# Patient Record
Sex: Female | Born: 1945 | Race: White | Hispanic: No
Health system: Southern US, Community
[De-identification: ages and names within clinical notes are randomized; demographics above are authoritative.]

---

## 2006-01-14 ENCOUNTER — Encounter: Admission: RE | Admit: 2006-01-14 | Discharge: 2006-01-14 | Payer: Self-pay

## 2006-01-20 ENCOUNTER — Encounter (INDEPENDENT_AMBULATORY_CARE_PROVIDER_SITE_OTHER): Payer: Self-pay | Admitting: Diagnostic Radiology

## 2006-01-20 ENCOUNTER — Encounter: Admission: RE | Admit: 2006-01-20 | Discharge: 2006-01-20 | Payer: Self-pay

## 2006-01-20 ENCOUNTER — Encounter (INDEPENDENT_AMBULATORY_CARE_PROVIDER_SITE_OTHER): Payer: Self-pay | Admitting: Specialist

## 2006-02-04 ENCOUNTER — Encounter: Admission: RE | Admit: 2006-02-04 | Discharge: 2006-02-04 | Payer: Self-pay | Admitting: General Surgery

## 2006-02-06 ENCOUNTER — Ambulatory Visit: Payer: Self-pay | Admitting: Oncology

## 2006-02-11 ENCOUNTER — Encounter: Admission: RE | Admit: 2006-02-11 | Discharge: 2006-02-11 | Payer: Self-pay | Admitting: General Surgery

## 2006-02-11 ENCOUNTER — Ambulatory Visit: Admission: RE | Admit: 2006-02-11 | Discharge: 2006-03-11 | Payer: Self-pay | Admitting: Radiation Oncology

## 2006-02-13 ENCOUNTER — Encounter: Admission: RE | Admit: 2006-02-13 | Discharge: 2006-02-13 | Payer: Self-pay | Admitting: General Surgery

## 2006-02-13 ENCOUNTER — Encounter (INDEPENDENT_AMBULATORY_CARE_PROVIDER_SITE_OTHER): Payer: Self-pay | Admitting: Specialist

## 2006-02-13 ENCOUNTER — Ambulatory Visit (HOSPITAL_BASED_OUTPATIENT_CLINIC_OR_DEPARTMENT_OTHER): Admission: RE | Admit: 2006-02-13 | Discharge: 2006-02-13 | Payer: Self-pay | Admitting: General Surgery

## 2006-02-27 ENCOUNTER — Ambulatory Visit (HOSPITAL_COMMUNITY): Admission: RE | Admit: 2006-02-27 | Discharge: 2006-02-27 | Payer: Self-pay | Admitting: General Surgery

## 2006-03-09 LAB — COMPREHENSIVE METABOLIC PANEL
ALT: 9 U/L (ref 0–35)
AST: 15 U/L (ref 0–37)
Albumin: 4.1 g/dL (ref 3.5–5.2)
Alkaline Phosphatase: 85 U/L (ref 39–117)
Calcium: 9.2 mg/dL (ref 8.4–10.5)
Chloride: 108 mEq/L (ref 96–112)
Potassium: 4.3 mEq/L (ref 3.5–5.3)

## 2006-03-09 LAB — CBC WITH DIFFERENTIAL/PLATELET
BASO%: 0.3 % (ref 0.0–2.0)
EOS%: 1.4 % (ref 0.0–7.0)
MCH: 32.5 pg (ref 26.0–34.0)
MCHC: 35.7 g/dL (ref 32.0–36.0)
RBC: 3.49 10*6/uL — ABNORMAL LOW (ref 3.70–5.32)
RDW: 14.2 % (ref 11.3–14.5)
lymph#: 1.1 10*3/uL (ref 0.9–3.3)

## 2006-03-17 ENCOUNTER — Ambulatory Visit (HOSPITAL_COMMUNITY): Admission: RE | Admit: 2006-03-17 | Discharge: 2006-03-17 | Payer: Self-pay | Admitting: Oncology

## 2006-03-17 ENCOUNTER — Encounter (INDEPENDENT_AMBULATORY_CARE_PROVIDER_SITE_OTHER): Payer: Self-pay | Admitting: Cardiology

## 2006-03-23 ENCOUNTER — Ambulatory Visit: Payer: Self-pay | Admitting: Oncology

## 2006-03-31 LAB — CBC WITH DIFFERENTIAL/PLATELET
BASO%: 1.1 % (ref 0.0–2.0)
EOS%: 0.3 % (ref 0.0–7.0)
HCT: 34.1 % — ABNORMAL LOW (ref 34.8–46.6)
MCH: 31.8 pg (ref 26.0–34.0)
MCHC: 35.4 g/dL (ref 32.0–36.0)
MCV: 89.8 fL (ref 81.0–101.0)
MONO%: 11.3 % (ref 0.0–13.0)
NEUT%: 70.4 % (ref 39.6–76.8)
lymph#: 1.2 10*3/uL (ref 0.9–3.3)

## 2006-04-03 ENCOUNTER — Ambulatory Visit (HOSPITAL_BASED_OUTPATIENT_CLINIC_OR_DEPARTMENT_OTHER): Admission: RE | Admit: 2006-04-03 | Discharge: 2006-04-03 | Payer: Self-pay | Admitting: General Surgery

## 2006-04-13 LAB — CBC WITH DIFFERENTIAL/PLATELET
BASO%: 0.7 % (ref 0.0–2.0)
Basophils Absolute: 0 10*3/uL (ref 0.0–0.1)
EOS%: 1.2 % (ref 0.0–7.0)
HGB: 10.9 g/dL — ABNORMAL LOW (ref 11.6–15.9)
MCH: 31.6 pg (ref 26.0–34.0)
MCHC: 34 g/dL (ref 32.0–36.0)
MCV: 92.9 fL (ref 81.0–101.0)
MONO%: 10.8 % (ref 0.0–13.0)
RBC: 3.43 10*6/uL — ABNORMAL LOW (ref 3.70–5.32)
RDW: 13.7 % (ref 11.3–14.5)
lymph#: 1.1 10*3/uL (ref 0.9–3.3)

## 2006-04-20 LAB — CBC WITH DIFFERENTIAL/PLATELET
Basophils Absolute: 0 10*3/uL (ref 0.0–0.1)
EOS%: 0.6 % (ref 0.0–7.0)
Eosinophils Absolute: 0.1 10*3/uL (ref 0.0–0.5)
HCT: 28.6 % — ABNORMAL LOW (ref 34.8–46.6)
HGB: 10.1 g/dL — ABNORMAL LOW (ref 11.6–15.9)
MCH: 32.4 pg (ref 26.0–34.0)
MCV: 91.5 fL (ref 81.0–101.0)
NEUT#: 6.1 10*3/uL (ref 1.5–6.5)
NEUT%: 74.9 % (ref 39.6–76.8)
lymph#: 1.1 10*3/uL (ref 0.9–3.3)

## 2006-04-30 ENCOUNTER — Ambulatory Visit: Payer: Self-pay | Admitting: Oncology

## 2006-05-04 LAB — CBC WITH DIFFERENTIAL/PLATELET
Basophils Absolute: 0 10*3/uL (ref 0.0–0.1)
EOS%: 1.1 % (ref 0.0–7.0)
Eosinophils Absolute: 0.1 10*3/uL (ref 0.0–0.5)
HGB: 9.2 g/dL — ABNORMAL LOW (ref 11.6–15.9)
LYMPH%: 22.9 % (ref 14.0–48.0)
MCH: 31.8 pg (ref 26.0–34.0)
MCV: 91.6 fL (ref 81.0–101.0)
MONO%: 11.4 % (ref 0.0–13.0)
NEUT%: 63.8 % (ref 39.6–76.8)
Platelets: 227 10*3/uL (ref 145–400)
RDW: 14.8 % — ABNORMAL HIGH (ref 11.3–14.5)

## 2006-05-14 LAB — CBC WITH DIFFERENTIAL/PLATELET
BASO%: 0.1 % (ref 0.0–2.0)
EOS%: 0.1 % (ref 0.0–7.0)
LYMPH%: 8.4 % — ABNORMAL LOW (ref 14.0–48.0)
MCH: 32.1 pg (ref 26.0–34.0)
MCHC: 34.9 g/dL (ref 32.0–36.0)
MCV: 91.9 fL (ref 81.0–101.0)
MONO%: 3.1 % (ref 0.0–13.0)
NEUT#: 14 10*3/uL — ABNORMAL HIGH (ref 1.5–6.5)
Platelets: 191 10*3/uL (ref 145–400)
RBC: 2.73 10*6/uL — ABNORMAL LOW (ref 3.70–5.32)
RDW: 17.5 % — ABNORMAL HIGH (ref 11.3–14.5)

## 2006-05-15 ENCOUNTER — Encounter (HOSPITAL_COMMUNITY): Admission: RE | Admit: 2006-05-15 | Discharge: 2006-05-15 | Payer: Self-pay | Admitting: Oncology

## 2006-05-15 LAB — CBC WITH DIFFERENTIAL/PLATELET
Eosinophils Absolute: 0.1 10*3/uL (ref 0.0–0.5)
HCT: 25.7 % — ABNORMAL LOW (ref 34.8–46.6)
LYMPH%: 14.2 % (ref 14.0–48.0)
MCHC: 34.7 g/dL (ref 32.0–36.0)
MCV: 92.4 fL (ref 81.0–101.0)
MONO#: 0.6 10*3/uL (ref 0.1–0.9)
MONO%: 4.6 % (ref 0.0–13.0)
NEUT%: 80.7 % — ABNORMAL HIGH (ref 39.6–76.8)
Platelets: 185 10*3/uL (ref 145–400)
RBC: 2.78 10*6/uL — ABNORMAL LOW (ref 3.70–5.32)
WBC: 12.7 10*3/uL — ABNORMAL HIGH (ref 3.9–10.0)

## 2006-05-15 LAB — COMPREHENSIVE METABOLIC PANEL
AST: 17 U/L (ref 0–37)
Albumin: 3.7 g/dL (ref 3.5–5.2)
Alkaline Phosphatase: 109 U/L (ref 39–117)
Glucose, Bld: 108 mg/dL — ABNORMAL HIGH (ref 70–99)
Potassium: 3.8 mEq/L (ref 3.5–5.3)
Sodium: 137 mEq/L (ref 135–145)
Total Bilirubin: 0.3 mg/dL (ref 0.3–1.2)
Total Protein: 5.7 g/dL — ABNORMAL LOW (ref 6.0–8.3)

## 2006-05-28 LAB — CBC WITH DIFFERENTIAL/PLATELET
Basophils Absolute: 0 10*3/uL (ref 0.0–0.1)
Eosinophils Absolute: 0.1 10*3/uL (ref 0.0–0.5)
HGB: 10.9 g/dL — ABNORMAL LOW (ref 11.6–15.9)
MCV: 92.1 fL (ref 81.0–101.0)
MONO#: 0.4 10*3/uL (ref 0.1–0.9)
MONO%: 7.9 % (ref 0.0–13.0)
NEUT#: 3.7 10*3/uL (ref 1.5–6.5)
RBC: 3.42 10*6/uL — ABNORMAL LOW (ref 3.70–5.32)
RDW: 14.9 % — ABNORMAL HIGH (ref 11.3–14.5)
WBC: 5.7 10*3/uL (ref 3.9–10.0)

## 2006-05-28 LAB — COMPREHENSIVE METABOLIC PANEL
Albumin: 3.7 g/dL (ref 3.5–5.2)
BUN: 15 mg/dL (ref 6–23)
CO2: 25 mEq/L (ref 19–32)
Calcium: 8.9 mg/dL (ref 8.4–10.5)
Chloride: 108 mEq/L (ref 96–112)
Glucose, Bld: 92 mg/dL (ref 70–99)
Potassium: 4 mEq/L (ref 3.5–5.3)
Sodium: 141 mEq/L (ref 135–145)
Total Protein: 6.2 g/dL (ref 6.0–8.3)

## 2006-06-04 LAB — CBC WITH DIFFERENTIAL/PLATELET
BASO%: 0.3 % (ref 0.0–2.0)
EOS%: 1.2 % (ref 0.0–7.0)
HGB: 11.1 g/dL — ABNORMAL LOW (ref 11.6–15.9)
LYMPH%: 37 % (ref 14.0–48.0)
MCV: 92.1 fL (ref 81.0–101.0)
NEUT#: 0.9 10*3/uL — ABNORMAL LOW (ref 1.5–6.5)
NEUT%: 32 % — ABNORMAL LOW (ref 39.6–76.8)
RBC: 3.45 10*6/uL — ABNORMAL LOW (ref 3.70–5.32)
RDW: 17.6 % — ABNORMAL HIGH (ref 11.3–14.5)
WBC: 2.9 10*3/uL — ABNORMAL LOW (ref 3.9–10.0)
lymph#: 1.1 10*3/uL (ref 0.9–3.3)

## 2006-06-15 ENCOUNTER — Ambulatory Visit: Payer: Self-pay | Admitting: Oncology

## 2006-06-17 LAB — CBC WITH DIFFERENTIAL/PLATELET
BASO%: 0.8 % (ref 0.0–2.0)
EOS%: 0.5 % (ref 0.0–7.0)
HCT: 27.7 % — ABNORMAL LOW (ref 34.8–46.6)
MCH: 32.8 pg (ref 26.0–34.0)
MCHC: 35.4 g/dL (ref 32.0–36.0)
MONO%: 6.5 % (ref 0.0–13.0)
NEUT%: 75.7 % (ref 39.6–76.8)
lymph#: 1.1 10*3/uL (ref 0.9–3.3)

## 2006-06-17 LAB — COMPREHENSIVE METABOLIC PANEL
ALT: 14 U/L (ref 0–35)
Alkaline Phosphatase: 101 U/L (ref 39–117)
Sodium: 138 mEq/L (ref 135–145)
Total Bilirubin: 0.4 mg/dL (ref 0.3–1.2)
Total Protein: 6.1 g/dL (ref 6.0–8.3)

## 2006-06-25 LAB — CBC WITH DIFFERENTIAL/PLATELET
BASO%: 0.1 % (ref 0.0–2.0)
Eosinophils Absolute: 0.1 10*3/uL (ref 0.0–0.5)
LYMPH%: 30.8 % (ref 14.0–48.0)
MCHC: 35.2 g/dL (ref 32.0–36.0)
MCV: 93.8 fL (ref 81.0–101.0)
MONO%: 20 % — ABNORMAL HIGH (ref 0.0–13.0)
NEUT%: 47.8 % (ref 39.6–76.8)
Platelets: 246 10*3/uL (ref 145–400)
RBC: 2.96 10*6/uL — ABNORMAL LOW (ref 3.70–5.32)

## 2006-07-03 LAB — CBC WITH DIFFERENTIAL/PLATELET
BASO%: 0.2 % (ref 0.0–2.0)
EOS%: 0.6 % (ref 0.0–7.0)
LYMPH%: 13 % — ABNORMAL LOW (ref 14.0–48.0)
MCH: 32.6 pg (ref 26.0–34.0)
MCHC: 34.8 g/dL (ref 32.0–36.0)
MONO#: 0.8 10*3/uL (ref 0.1–0.9)
Platelets: 268 10*3/uL (ref 145–400)
RBC: 2.87 10*6/uL — ABNORMAL LOW (ref 3.70–5.32)
WBC: 8.7 10*3/uL (ref 3.9–10.0)

## 2006-07-09 LAB — CBC WITH DIFFERENTIAL/PLATELET
BASO%: 0.8 % (ref 0.0–2.0)
EOS%: 1.2 % (ref 0.0–7.0)
HCT: 29 % — ABNORMAL LOW (ref 34.8–46.6)
MCH: 32.3 pg (ref 26.0–34.0)
MCHC: 34.5 g/dL (ref 32.0–36.0)
MONO#: 0.8 10*3/uL (ref 0.1–0.9)
NEUT%: 67.1 % (ref 39.6–76.8)
RBC: 3.1 10*6/uL — ABNORMAL LOW (ref 3.70–5.32)
RDW: 17.6 % — ABNORMAL HIGH (ref 11.3–14.5)
WBC: 6.7 10*3/uL (ref 3.9–10.0)
lymph#: 1.3 10*3/uL (ref 0.9–3.3)

## 2006-07-09 LAB — COMPREHENSIVE METABOLIC PANEL
ALT: 16 U/L (ref 0–35)
AST: 36 U/L (ref 0–37)
CO2: 22 mEq/L (ref 19–32)
Calcium: 8.8 mg/dL (ref 8.4–10.5)
Chloride: 108 mEq/L (ref 96–112)
Potassium: 4.3 mEq/L (ref 3.5–5.3)
Sodium: 140 mEq/L (ref 135–145)
Total Protein: 6.6 g/dL (ref 6.0–8.3)

## 2006-07-13 ENCOUNTER — Ambulatory Visit (HOSPITAL_COMMUNITY): Admission: RE | Admit: 2006-07-13 | Discharge: 2006-07-13 | Payer: Self-pay | Admitting: Oncology

## 2006-07-14 ENCOUNTER — Encounter: Payer: Self-pay | Admitting: Oncology

## 2006-07-14 ENCOUNTER — Ambulatory Visit: Admission: EM | Admit: 2006-07-14 | Discharge: 2006-07-14 | Payer: Self-pay | Admitting: Oncology

## 2006-07-16 LAB — CBC WITH DIFFERENTIAL/PLATELET
BASO%: 0.6 % (ref 0.0–2.0)
EOS%: 4.4 % (ref 0.0–7.0)
HCT: 28.1 % — ABNORMAL LOW (ref 34.8–46.6)
LYMPH%: 41.7 % (ref 14.0–48.0)
MCH: 33.5 pg (ref 26.0–34.0)
MCHC: 35.6 g/dL (ref 32.0–36.0)
MONO#: 0.6 10*3/uL (ref 0.1–0.9)
NEUT%: 27.4 % — ABNORMAL LOW (ref 39.6–76.8)
RBC: 2.99 10*6/uL — ABNORMAL LOW (ref 3.70–5.32)
WBC: 2.2 10*3/uL — ABNORMAL LOW (ref 3.9–10.0)
lymph#: 0.9 10*3/uL (ref 0.9–3.3)

## 2006-07-28 ENCOUNTER — Ambulatory Visit: Payer: Self-pay | Admitting: Oncology

## 2006-07-28 ENCOUNTER — Ambulatory Visit (HOSPITAL_COMMUNITY): Admission: RE | Admit: 2006-07-28 | Discharge: 2006-07-28 | Payer: Self-pay | Admitting: Oncology

## 2006-07-30 LAB — COMPREHENSIVE METABOLIC PANEL
ALT: 14 U/L (ref 0–35)
Albumin: 3.9 g/dL (ref 3.5–5.2)
Alkaline Phosphatase: 94 U/L (ref 39–117)
CO2: 24 mEq/L (ref 19–32)
Glucose, Bld: 96 mg/dL (ref 70–99)
Potassium: 3.9 mEq/L (ref 3.5–5.3)
Sodium: 141 mEq/L (ref 135–145)
Total Bilirubin: 0.4 mg/dL (ref 0.3–1.2)
Total Protein: 6.1 g/dL (ref 6.0–8.3)

## 2006-07-30 LAB — CBC WITH DIFFERENTIAL/PLATELET
BASO%: 0.6 % (ref 0.0–2.0)
Eosinophils Absolute: 0.1 10*3/uL (ref 0.0–0.5)
LYMPH%: 15.4 % (ref 14.0–48.0)
MCHC: 35 g/dL (ref 32.0–36.0)
MONO#: 0.6 10*3/uL (ref 0.1–0.9)
MONO%: 7.4 % (ref 0.0–13.0)
NEUT#: 6.2 10*3/uL (ref 1.5–6.5)
RBC: 3 10*6/uL — ABNORMAL LOW (ref 3.70–5.32)
RDW: 16.4 % — ABNORMAL HIGH (ref 11.3–14.5)
WBC: 8.2 10*3/uL (ref 3.9–10.0)

## 2007-01-13 ENCOUNTER — Ambulatory Visit: Payer: Self-pay | Admitting: Oncology

## 2007-01-15 ENCOUNTER — Ambulatory Visit: Admission: RE | Admit: 2007-01-15 | Discharge: 2007-01-15 | Payer: Self-pay | Admitting: Oncology

## 2007-01-15 ENCOUNTER — Encounter: Payer: Self-pay | Admitting: Oncology

## 2007-01-15 ENCOUNTER — Ambulatory Visit: Payer: Self-pay | Admitting: Internal Medicine

## 2007-01-15 LAB — CBC WITH DIFFERENTIAL/PLATELET
BASO%: 0.8 % (ref 0.0–2.0)
EOS%: 1.4 % (ref 0.0–7.0)
LYMPH%: 28.4 % (ref 14.0–48.0)
MCHC: 35.5 g/dL (ref 32.0–36.0)
MCV: 96.6 fL (ref 81.0–101.0)
MONO%: 10.7 % (ref 0.0–13.0)
NEUT#: 2.2 10*3/uL (ref 1.5–6.5)
Platelets: 228 10*3/uL (ref 145–400)
RBC: 3.37 10*6/uL — ABNORMAL LOW (ref 3.70–5.32)
RDW: 13.4 % (ref 11.3–14.5)

## 2007-01-15 LAB — COMPREHENSIVE METABOLIC PANEL
ALT: 14 U/L (ref 0–35)
AST: 20 U/L (ref 0–37)
Alkaline Phosphatase: 78 U/L (ref 39–117)
Creatinine, Ser: 1 mg/dL (ref 0.40–1.20)
Sodium: 141 mEq/L (ref 135–145)
Total Bilirubin: 0.7 mg/dL (ref 0.3–1.2)
Total Protein: 5.9 g/dL — ABNORMAL LOW (ref 6.0–8.3)

## 2007-01-15 LAB — CANCER ANTIGEN 27.29: CA 27.29: 26 U/mL (ref 0–39)

## 2007-02-08 ENCOUNTER — Encounter: Admission: RE | Admit: 2007-02-08 | Discharge: 2007-02-08 | Payer: Self-pay | Admitting: Oncology

## 2007-04-23 ENCOUNTER — Ambulatory Visit: Payer: Self-pay | Admitting: Oncology

## 2007-04-28 ENCOUNTER — Encounter: Payer: Self-pay | Admitting: Oncology

## 2007-04-28 ENCOUNTER — Ambulatory Visit: Admission: RE | Admit: 2007-04-28 | Discharge: 2007-04-28 | Payer: Self-pay | Admitting: Oncology

## 2007-04-28 ENCOUNTER — Ambulatory Visit: Payer: Self-pay | Admitting: Cardiology

## 2007-04-28 ENCOUNTER — Ambulatory Visit (HOSPITAL_COMMUNITY): Admission: RE | Admit: 2007-04-28 | Discharge: 2007-04-28 | Payer: Self-pay | Admitting: Oncology

## 2007-04-28 LAB — CBC WITH DIFFERENTIAL/PLATELET
BASO%: 0.2 % (ref 0.0–2.0)
HCT: 32 % — ABNORMAL LOW (ref 34.8–46.6)
MCHC: 36 g/dL (ref 32.0–36.0)
MONO#: 0.4 10*3/uL (ref 0.1–0.9)
NEUT%: 54.8 % (ref 39.6–76.8)
WBC: 3.9 10*3/uL (ref 3.9–10.0)
lymph#: 1.3 10*3/uL (ref 0.9–3.3)

## 2007-04-28 LAB — COMPREHENSIVE METABOLIC PANEL
ALT: 19 U/L (ref 0–35)
Albumin: 3.5 g/dL (ref 3.5–5.2)
CO2: 30 mEq/L (ref 19–32)
Calcium: 8.8 mg/dL (ref 8.4–10.5)
Chloride: 107 mEq/L (ref 96–112)
Creatinine, Ser: 0.92 mg/dL (ref 0.40–1.20)
Sodium: 141 mEq/L (ref 135–145)
Total Protein: 6.2 g/dL (ref 6.0–8.3)

## 2007-04-28 LAB — CANCER ANTIGEN 27.29: CA 27.29: 25 U/mL (ref 0–39)

## 2007-05-03 ENCOUNTER — Ambulatory Visit (HOSPITAL_BASED_OUTPATIENT_CLINIC_OR_DEPARTMENT_OTHER): Admission: RE | Admit: 2007-05-03 | Discharge: 2007-05-03 | Payer: Self-pay | Admitting: General Surgery

## 2007-07-02 IMAGING — CT CT CHEST W/ CM
1 of 2 series · 15 of 32 positions shown, 19 images · IV contrast (omnipaque)
Comparison: 03/17/2006

CLINICAL DATA: Breast cancer
CHEST CT WITH CONTRAST
TECHNIQUE: Multidetector CT imaging of the chest was performed following the
standard protocol during bolus administration of intravenous contrast.

Contrast:  80 cc Omnipaque 300

[Series 2: chest routine 5.0 b40f · axial · 0.65mm/px · z∈[-342,-68]mm · 15 of 61 slices shown, 19 images]
[im 3/61  mediastinal]
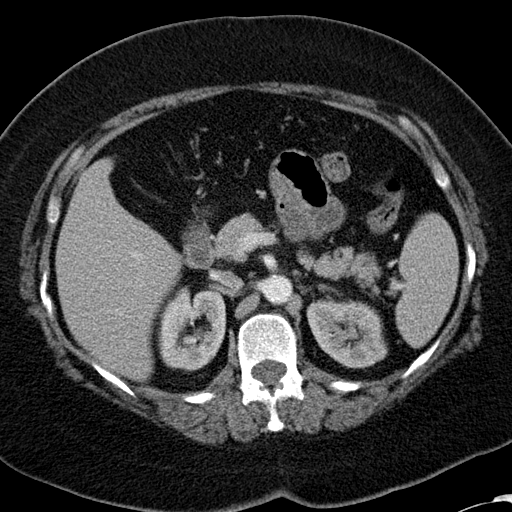
[im 3/61  lung]
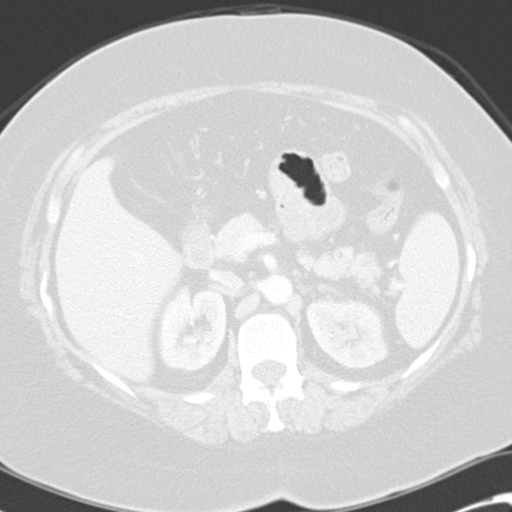
[im 7/61  lung]
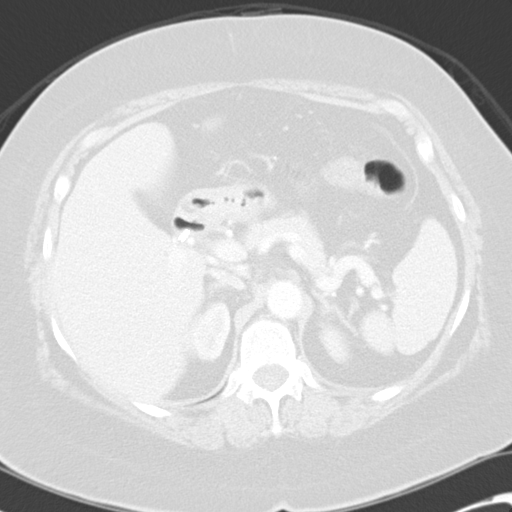
[im 12/61  lung]
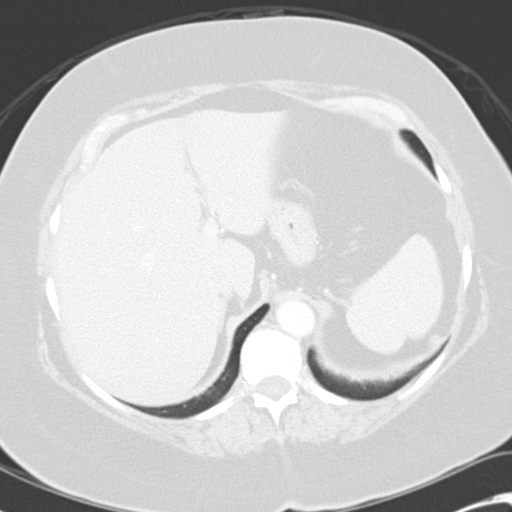
[im 16/61  lung]
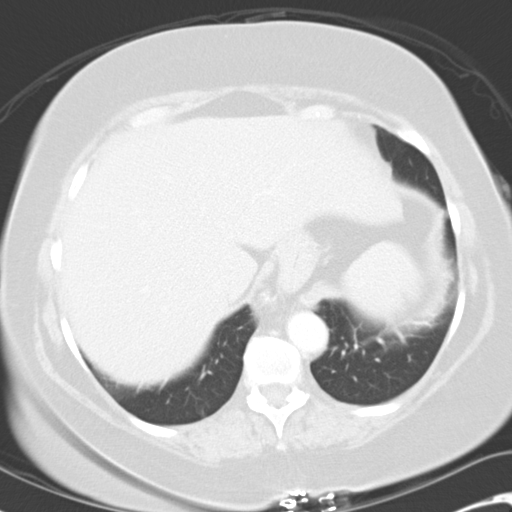
[im 18/61  mediastinal]
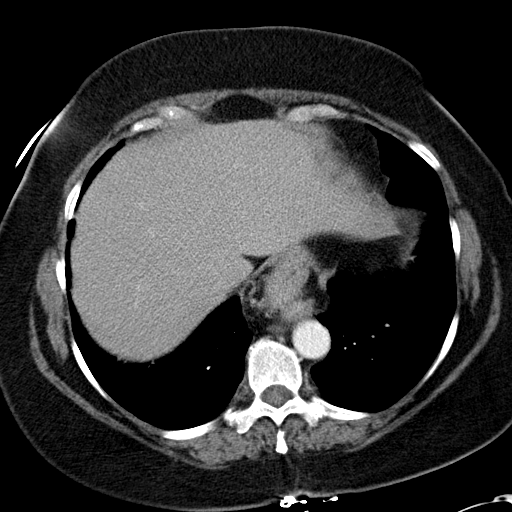
[im 18/61  lung]
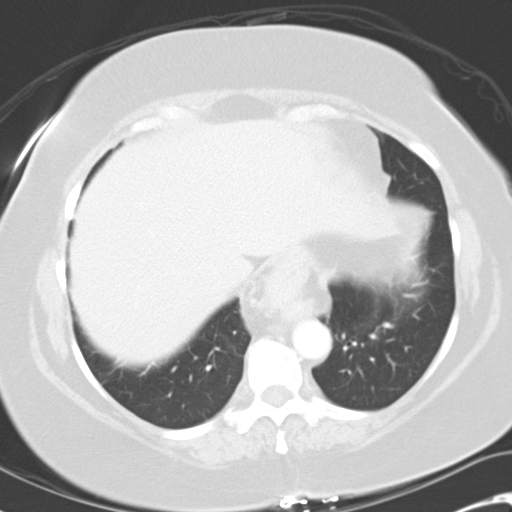
[im 23/61  lung]
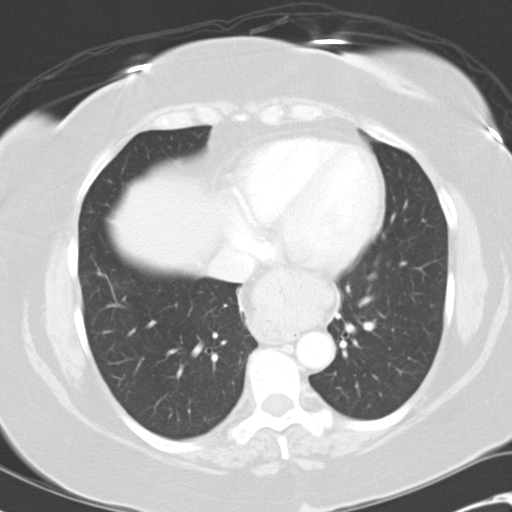
[im 27/61  lung]
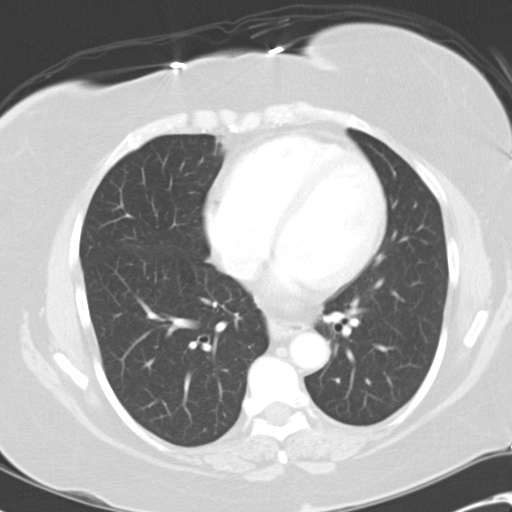
[im 32/61  lung]
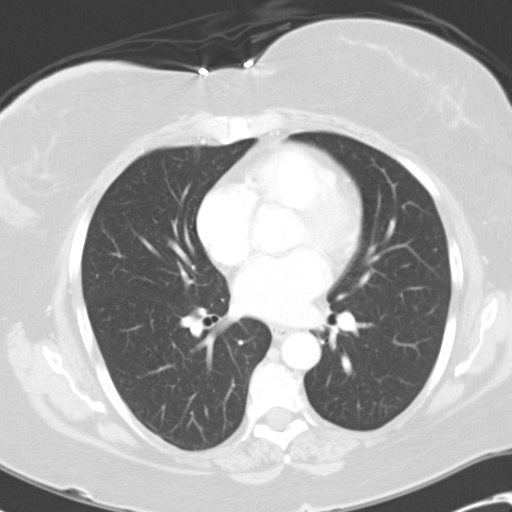
[im 34/61  mediastinal]
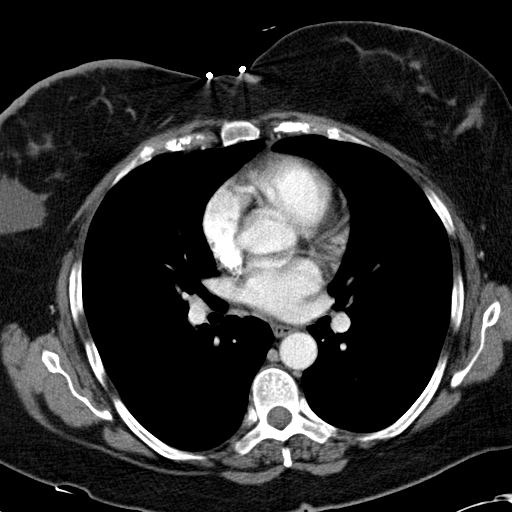
[im 34/61  lung]
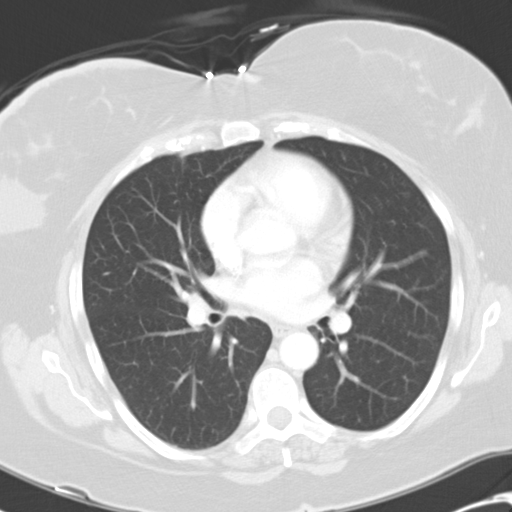
[im 38/61  lung]
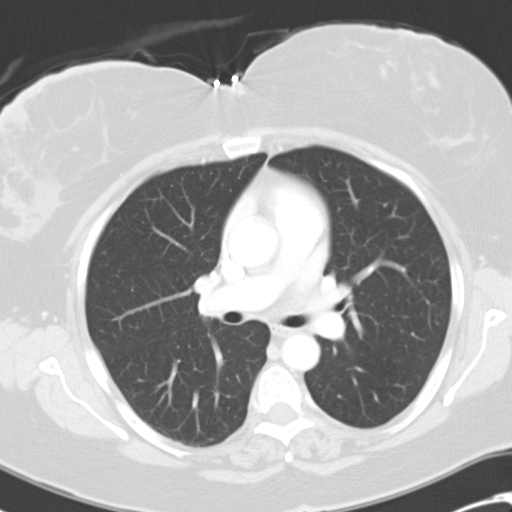
[im 43/61  lung]
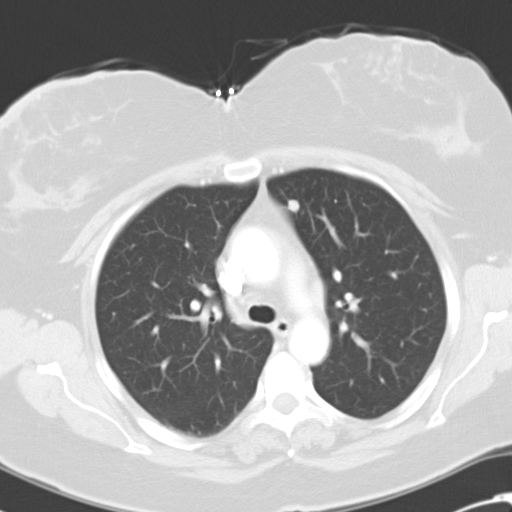
[im 45/61  lung]
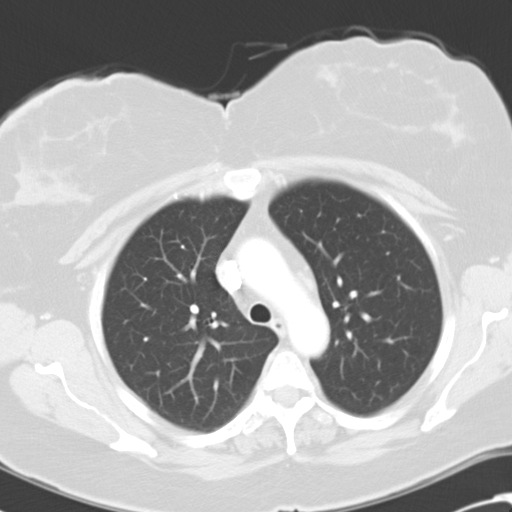
[im 49/61  mediastinal]
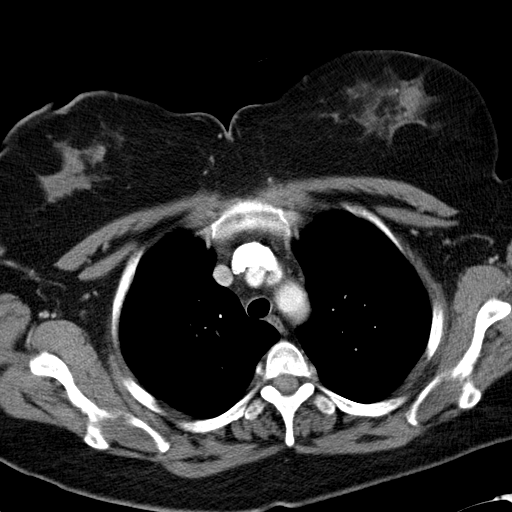
[im 49/61  lung]
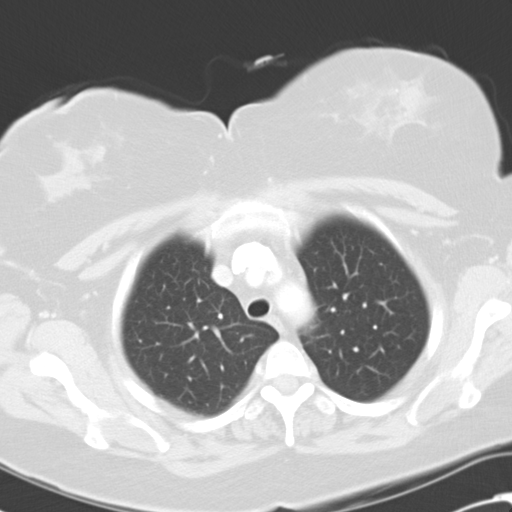
[im 54/61  lung]
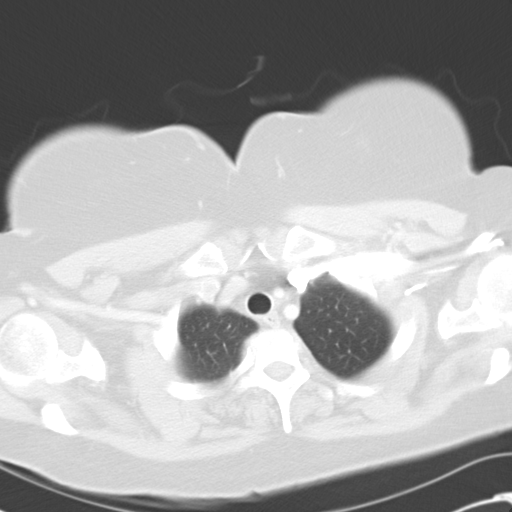
[im 58/61  lung]
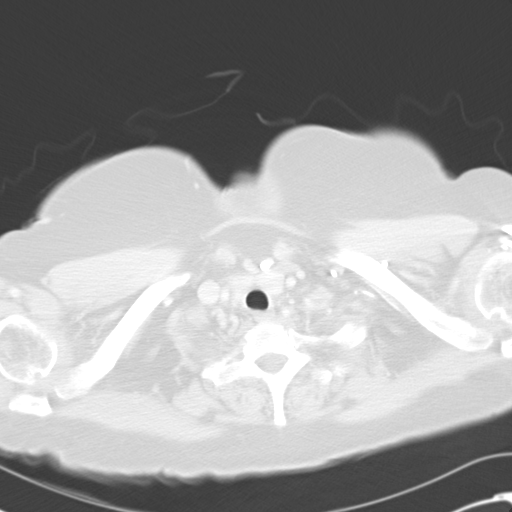

[15 of 32 positions shown; findings below may reference images not displayed]

FINDINGS: No enlarge axillary lymph nodes.

No mediastinal, or hilar adenopathy.

No pericardial, or pleural effusions.

There is a large hiatal hernia.

The pulmonary nodule within the left upper lobe measures 8.3 x 7.4 mm.
Previously, this measured 7.3 x 7.3 mm. New small adjacent nodule measures and
5.9 x 4.7 mm. This was not present on the prior exam.

No additional pulmonary nodules or masses are noted.

Review of the bone window shows no lytic or sclerotic lesions.

IMPRESSION

Left upper lobe pulmonary nodule is minimally increased in size compared to
prior exam. Additionally, there is an adjacent nodule which is new from the
prior study. PET-CT is suggested to assess the degree of hypermetabolism within
these nodules.

## 2008-01-07 ENCOUNTER — Ambulatory Visit: Payer: Self-pay | Admitting: Oncology

## 2008-01-11 ENCOUNTER — Ambulatory Visit (HOSPITAL_COMMUNITY): Admission: RE | Admit: 2008-01-11 | Discharge: 2008-01-11 | Payer: Self-pay | Admitting: Oncology

## 2008-01-11 LAB — CBC WITH DIFFERENTIAL/PLATELET
Basophils Absolute: 0 10*3/uL (ref 0.0–0.1)
Eosinophils Absolute: 0 10*3/uL (ref 0.0–0.5)
HGB: 13.1 g/dL (ref 11.6–15.9)
LYMPH%: 23.7 % (ref 14.0–48.0)
MCV: 98.8 fL (ref 81.0–101.0)
MONO%: 10.1 % (ref 0.0–13.0)
NEUT#: 3.6 10*3/uL (ref 1.5–6.5)
NEUT%: 64.9 % (ref 39.6–76.8)
Platelets: 229 10*3/uL (ref 145–400)

## 2008-01-11 LAB — COMPREHENSIVE METABOLIC PANEL
Albumin: 3.8 g/dL (ref 3.5–5.2)
Alkaline Phosphatase: 75 U/L (ref 39–117)
BUN: 18 mg/dL (ref 6–23)
Creatinine, Ser: 0.89 mg/dL (ref 0.40–1.20)
Glucose, Bld: 89 mg/dL (ref 70–99)
Potassium: 4 mEq/L (ref 3.5–5.3)
Total Bilirubin: 0.6 mg/dL (ref 0.3–1.2)

## 2008-01-12 LAB — VITAMIN D 25 HYDROXY (VIT D DEFICIENCY, FRACTURES): Vit D, 25-Hydroxy: 33 ng/mL (ref 30–89)

## 2008-02-07 ENCOUNTER — Encounter: Admission: RE | Admit: 2008-02-07 | Discharge: 2008-02-07 | Payer: Self-pay | Admitting: Oncology

## 2009-01-31 ENCOUNTER — Ambulatory Visit: Payer: Self-pay | Admitting: Oncology

## 2009-02-05 ENCOUNTER — Encounter: Admission: RE | Admit: 2009-02-05 | Discharge: 2009-02-05 | Payer: Self-pay | Admitting: Oncology

## 2009-02-05 LAB — COMPREHENSIVE METABOLIC PANEL
ALT: 13 U/L (ref 0–35)
AST: 17 U/L (ref 0–37)
Albumin: 3.9 g/dL (ref 3.5–5.2)
Alkaline Phosphatase: 54 U/L (ref 39–117)
BUN: 19 mg/dL (ref 6–23)
CO2: 26 mEq/L (ref 19–32)
Calcium: 9.2 mg/dL (ref 8.4–10.5)
Chloride: 105 mEq/L (ref 96–112)
Creatinine, Ser: 0.89 mg/dL (ref 0.40–1.20)
Glucose, Bld: 91 mg/dL (ref 70–99)
Potassium: 4.1 mEq/L (ref 3.5–5.3)
Sodium: 140 mEq/L (ref 135–145)
Total Bilirubin: 0.3 mg/dL (ref 0.3–1.2)
Total Protein: 6 g/dL (ref 6.0–8.3)

## 2009-02-05 LAB — CBC WITH DIFFERENTIAL/PLATELET
BASO%: 0.4 % (ref 0.0–2.0)
Basophils Absolute: 0 10*3/uL (ref 0.0–0.1)
EOS%: 1.3 % (ref 0.0–7.0)
MCH: 35.6 pg — ABNORMAL HIGH (ref 25.1–34.0)
MCHC: 34.8 g/dL (ref 31.5–36.0)
MCV: 102.3 fL — ABNORMAL HIGH (ref 79.5–101.0)
MONO%: 7.7 % (ref 0.0–14.0)
RBC: 3.46 10*6/uL — ABNORMAL LOW (ref 3.70–5.45)
RDW: 13 % (ref 11.2–14.5)
lymph#: 1.4 10*3/uL (ref 0.9–3.3)

## 2009-02-05 LAB — CANCER ANTIGEN 27.29: CA 27.29: 25 U/mL (ref 0–39)

## 2009-10-23 ENCOUNTER — Ambulatory Visit: Payer: Self-pay | Admitting: Oncology

## 2010-01-17 ENCOUNTER — Ambulatory Visit (HOSPITAL_COMMUNITY)
Admission: RE | Admit: 2010-01-17 | Discharge: 2010-01-17 | Payer: Self-pay | Source: Home / Self Care | Attending: Oncology | Admitting: Oncology

## 2010-01-30 ENCOUNTER — Ambulatory Visit: Payer: Self-pay | Admitting: Oncology

## 2010-02-05 ENCOUNTER — Encounter
Admission: RE | Admit: 2010-02-05 | Discharge: 2010-02-05 | Payer: Self-pay | Source: Home / Self Care | Attending: Oncology | Admitting: Oncology

## 2010-02-05 LAB — CBC & DIFF AND RETIC
BASO%: 0.5 % (ref 0.0–2.0)
EOS%: 0.9 % (ref 0.0–7.0)
HCT: 37.2 % (ref 34.8–46.6)
HGB: 12.9 g/dL (ref 11.6–15.9)
Immature Retic Fract: 3.7 % (ref 0.00–10.70)
MCH: 33.4 pg (ref 25.1–34.0)
MCHC: 34.7 g/dL (ref 31.5–36.0)
MONO#: 0.5 10*3/uL (ref 0.1–0.9)
NEUT%: 63.2 % (ref 38.4–76.8)
RDW: 13.1 % (ref 11.2–14.5)
Retic %: 0.89 % (ref 0.50–1.50)
Retic Ct Abs: 34.35 10*3/uL (ref 18.30–72.70)
WBC: 4.3 10*3/uL (ref 3.9–10.3)
lymph#: 1 10*3/uL (ref 0.9–3.3)

## 2010-02-05 LAB — COMPREHENSIVE METABOLIC PANEL
ALT: 12 U/L (ref 0–35)
AST: 17 U/L (ref 0–37)
Albumin: 4.2 g/dL (ref 3.5–5.2)
Alkaline Phosphatase: 71 U/L (ref 39–117)
BUN: 15 mg/dL (ref 6–23)
CO2: 28 mEq/L (ref 19–32)
Calcium: 9.5 mg/dL (ref 8.4–10.5)
Chloride: 108 mEq/L (ref 96–112)
Creatinine, Ser: 0.81 mg/dL (ref 0.40–1.20)
Glucose, Bld: 63 mg/dL — ABNORMAL LOW (ref 70–99)
Potassium: 4.3 mEq/L (ref 3.5–5.3)
Sodium: 144 mEq/L (ref 135–145)
Total Bilirubin: 0.4 mg/dL (ref 0.3–1.2)
Total Protein: 6.2 g/dL (ref 6.0–8.3)

## 2010-02-05 LAB — FERRITIN: Ferritin: 29 ng/mL (ref 10–291)

## 2010-02-24 ENCOUNTER — Encounter: Payer: Self-pay | Admitting: General Surgery

## 2010-04-29 ENCOUNTER — Other Ambulatory Visit: Payer: Self-pay | Admitting: Oncology

## 2010-04-29 ENCOUNTER — Encounter (HOSPITAL_BASED_OUTPATIENT_CLINIC_OR_DEPARTMENT_OTHER): Payer: Medicare Other | Admitting: Oncology

## 2010-04-29 DIAGNOSIS — Z170 Estrogen receptor positive status [ER+]: Secondary | ICD-10-CM

## 2010-04-29 DIAGNOSIS — Z9889 Other specified postprocedural states: Secondary | ICD-10-CM

## 2010-04-29 DIAGNOSIS — Z780 Asymptomatic menopausal state: Secondary | ICD-10-CM

## 2010-04-29 DIAGNOSIS — Z853 Personal history of malignant neoplasm of breast: Secondary | ICD-10-CM

## 2010-04-29 DIAGNOSIS — C50419 Malignant neoplasm of upper-outer quadrant of unspecified female breast: Secondary | ICD-10-CM

## 2010-04-29 LAB — CBC WITH DIFFERENTIAL/PLATELET
Basophils Absolute: 0 10*3/uL (ref 0.0–0.1)
EOS%: 1.4 % (ref 0.0–7.0)
Eosinophils Absolute: 0.1 10*3/uL (ref 0.0–0.5)
LYMPH%: 31.6 % (ref 14.0–49.7)
MCH: 33.5 pg (ref 25.1–34.0)
MCV: 97.9 fL (ref 79.5–101.0)
MONO%: 8.9 % (ref 0.0–14.0)
NEUT#: 2.6 10*3/uL (ref 1.5–6.5)
Platelets: 184 10*3/uL (ref 145–400)
RBC: 3.79 10*6/uL (ref 3.70–5.45)

## 2010-04-29 LAB — COMPREHENSIVE METABOLIC PANEL
AST: 22 U/L (ref 0–37)
Alkaline Phosphatase: 65 U/L (ref 39–117)
BUN: 14 mg/dL (ref 6–23)
Glucose, Bld: 84 mg/dL (ref 70–99)
Sodium: 142 mEq/L (ref 135–145)
Total Bilirubin: 0.7 mg/dL (ref 0.3–1.2)

## 2010-04-30 ENCOUNTER — Ambulatory Visit
Admission: RE | Admit: 2010-04-30 | Discharge: 2010-04-30 | Disposition: A | Discharge status: Home / Self Care | Payer: Medicare Other | Source: Ambulatory Visit | Attending: Oncology | Admitting: Oncology

## 2010-04-30 DIAGNOSIS — Z780 Asymptomatic menopausal state: Secondary | ICD-10-CM

## 2010-04-30 DIAGNOSIS — Z853 Personal history of malignant neoplasm of breast: Secondary | ICD-10-CM

## 2010-06-18 NOTE — Op Note (Signed)
Michele Reese, Michele Reese                ACCOUNT NO.:  [id number]   MEDICAL RECORD NO.:  [id number]          PATIENT TYPE:  AMB   LOCATION:  DSC                          FACILITY:  [facility]   PHYSICIAN:  Ollen Gross. Vernell Morgans, M.D. DATE OF BIRTH:  July 16, 1945   DATE OF PROCEDURE:  05/03/2007  DATE OF DISCHARGE:  04/28/2007                               OPERATIVE REPORT   PREOPERATIVE DIAGNOSIS:  Right breast cancer.   POSTOPERATIVE DIAGNOSIS:  Right breast cancer.   PROCEDURE:  Removal of Port-A-Cath.   SURGEON:  Ollen Gross. Vernell Morgans, M.D.   ANESTHESIA:  Local with IV sedation.   PROCEDURE:  After informed consent was obtained, the patient was brought  to the operating room and placed in supine position on the operating  table.  After adequate IV sedation was given,  the patient's left chest  area was prepped with Betadine and draped in usual sterile manner.  The  area of her old incision for a Port-A-Cath was infiltrated with 1%  lidocaine with epinephrine.  Small incision was made through her old  incision with a 15 blade knife.  This incision was carried down through  the subcutaneous tissue sharply with Metzenbaum scissors.  Once the port  was identified.  The capsule around the port was opened sharply with  tenotomy scissors.  The two anchoring stitches were identified.  Each  were grasped with a hemostat and divided and removed from the patient.  Once the anchoring stitches had been removed and the capsule then fully  opened, the port was able to be popped out of its cavity and with gentle  traction, the port was removed from the patient.  Pressure was held on  the tract for several minutes until the area was completely hemostatic.  The deep layer of the wound was then closed with interrupted 3-0 Vicryl  stitches and the skin was closed with interrupted 4-0 Monocryl  subcuticular stitches and Dermabond dressing was applied.  The patient  tolerated procedure well. At the end of the case all  needle, sponge, and  instrument counts were correct.  The patient was awakened and taken to  recovery in stable condition.      Ollen Gross. Vernell Morgans, M.D.  Electronically Signed     PST/MEDQ  D:  05/03/2007  T:  05/03/2007  Job:  [id number]

## 2010-06-21 NOTE — Op Note (Signed)
NAMEJENITA, Michele Reese                ACCOUNT NO.:  [id number]   MEDICAL RECORD NO.:  [id number]          PATIENT TYPE:  AMB   LOCATION:  DAY                          FACILITY:  [facility]   PHYSICIAN:  Ollen Gross. Vernell Morgans, M.D. DATE OF BIRTH:  1945-02-18   DATE OF PROCEDURE:  02/27/2006  DATE OF DISCHARGE:                               OPERATIVE REPORT   PREOPERATIVE DIAGNOSIS:  Right breast cancer.   POSTOP DIAGNOSIS:  Right breast cancer.   PROCEDURE:  Placement of MammoSite catheter.   SURGEON:  Ollen Gross. Vernell Morgans, M.D.   ANESTHESIA:  General via LMA.   DESCRIPTION OF PROCEDURE:  After informed consent was obtained, the  patient was brought to the operating room and placed in the supine  position on the operating room table. After induction of general  anesthesia, the patient's right breast was prepped with Betadine and  draped in the usual sterile manner.  The right breast was ultrasounded;  and there appeared to be a good well-defined cavity in the right breast.  There was good skin-to-cavity distance of a approximately 1.8 cm.  The  lateral aspect of the patient's previous lumpectomy incision was then  infiltrated with 1/4% Marcaine.  About a centimeter of the lateral  portion of the incision was opened sharply with a 15-blade knife.  A  large trocar was then used to access the seroma cavity through this  opening.  The seroma fluid was evacuated.   A large MammoSite balloon was chosen and tested and the catheter looked  good.  The trocar and sheath were then removed; and the MammoSite  balloon was placed through this opening into the cavity.  The MammoSite  balloon was filled with 80 mL of saline and contrast; and repeat  ultrasound of the cavity showed good confirmation around the balloon,  and the balloon looked good.  There did not appear to be any air pockets  around the balloon.  The skin distance on the final check was  approximately 1.4 cm.  With the balloon in the cavity,  nicely, the firm  dilator was removed; and the soft flexible dilator was then inserted.  A  single 3-0 nylon stitch was used to anchor the catheter to the skin; and  sterile dressings were applied.  The patient tolerated the procedure  well.  At the end of the case all needle, sponge, and instrument counts  were correct.  The patient was then awakened; and taken to recovery in  stable condition.      Ollen Gross. Vernell Morgans, M.D.  Electronically Signed     PST/MEDQ  D:  02/27/2006  T:  02/27/2006  Job:  [id number]

## 2010-06-21 NOTE — Op Note (Signed)
NAME:  Michele Reese, Michele Reese                ACCOUNT NO.:  [id number]   MEDICAL RECORD NO.:  [id number]          PATIENT TYPE:  AMB   LOCATION:  DSC                          FACILITY:  [facility]   PHYSICIAN:  Ollen Gross. Vernell Morgans, M.D. DATE OF BIRTH:  [birth year]   DATE OF PROCEDURE:  02/13/2006  DATE OF DISCHARGE:                               OPERATIVE REPORT   PREOPERATIVE DIAGNOSIS:  Right breast cancer.   POSTOPERATIVE DIAGNOSIS:  Right breast cancer.   PROCEDURE:  Right breast needle localized lumpectomy and sentinel lymph  node biopsy with injection of blue dye.   SURGEON:  Ollen Gross. Vernell Morgans, M.D.   ANESTHESIA:  General.   DESCRIPTION OF PROCEDURE:  After informed consent was obtained, the  patient was brought to the operating room and placed in the supine  position on the operating table.  After induction of general anesthesia,  the patient's right breast and axilla were prepped with Betadine and  draped in the usual sterile manner.  Earlier in the day, the patient had  undergone a wire localization procedure as well as injection of 1 mCi of  technetium sulfur colloid in the subareolar region.  At this point, 2 mL  of methylene blue and 3 mL of injectable saline were also injected in  the subareolar position.  The breast was massaged for about five  minutes.   Using the NeoProbe, an area of increased radioactivity was identified in  the right axilla.  A small transverse incision was made through the  axilla with a 15 blade knife.  This incision was carried down through  the skin and subcutaneous tissue sharply with the electrocautery until  the axilla was entered. The NeoProbe was used to direct the area of  dissection. Blunt hemostat dissection was used to identify a blue lymph  node that also had increased radioactivity. This lymph node was  dissected free using a combination of sharp dissection with  electrocautery.  The lymphatics were then clamped with hemostats,  divided, and  ligated 3-0 Vicryl ties.  The ex vivo counts on this lymph  node were approximately 100.  This was sent as sentinel node #1. No  other areas of increased radioactivity were identified in the right  axilla.  The axilla was examined and found to be hemostatic.  The deep  layer of the axilla was then closed with interrupted 3-0 Vicryl stitches  and the skin was closed with a running 4-0 Monocryl subcuticular stitch.   At this point, attention was turned to the right breast.  The patient  had undergone a wire localization procedure and the wire was entering  laterally in the breast.  A small transverse incision was made lateral  to the areola overlying the area of the path of the wire.  This incision  was carried down through the skin and saphenous tissue sharply with  electrocautery.  Once into the breast tissue, the path of the wire was  able to be identified and a circular piece of breast tissue was taken  out around the path of the wire.  This was  done sharply with  electrocautery.  This specimen was then oriented with a long stitch  being lateral and a short stitch being superior, and sent to radiology  and then to pathology.  Touch preps on the margins of the specimen as  well as touch preps of the lymph node were all negative.  Hemostasis was  achieved using Bovie electrocautery.  The wound was then infiltrated  with 0.25% Marcaine.  The wound was irrigated with copious amounts of  saline.  The deep layer of the breast incision was then closed with  interrupted 3-0 Vicryl stitches and the skin was closed with a running 4-  0 Monocryl subcuticular stitch.  Benzoin,  Steri-Strips and sterile dressings were applied.  The patient tolerated  the procedure well.  At the end of the case, all needle, sponge, and  instrument counts were correct.  The patient was then awakened and taken  to the recovery room in stable condition.      Ollen Gross. Vernell Morgans, M.D.  Electronically Signed      PST/MEDQ  D:  02/13/2006  T:  02/13/2006  Job:  [id number]

## 2010-06-21 NOTE — Op Note (Signed)
NAMEELECTA, STERRY                ACCOUNT NO.:  [id number]   MEDICAL RECORD NO.:  [id number]          PATIENT TYPE:  AMB   LOCATION:  DSC                          FACILITY:  [facility]   PHYSICIAN:  Ollen Gross. Vernell Morgans, M.D. DATE OF BIRTH:  [birth year]   DATE OF PROCEDURE:  04/03/2006  DATE OF DISCHARGE:                               OPERATIVE REPORT   PREOPERATIVE DIAGNOSIS:  Right breast cancer.   POSTOPERATIVE DIAGNOSIS:  Right breast cancer.   PROCEDURE:  Placement of left subclavian vein Port-A-Cath.   SURGEON:  Ollen Gross. Vernell Morgans, M.D.   ANESTHESIA:  General endotracheal.   PROCEDURE:  After informed consent was obtained, the patient was brought  to the operating room and placed in supine position on the operating  room table.  After adequate induction of general anesthesia, a roll was  placed between the patient's shoulder blades.  Then the left chest and  neck area were prepped with Betadine and draped in the usual sterile  manner.  The patient was then placed in Trendelenburg position.  The  area just lateral to the bend of the clavicle on the left was  infiltrated with 0.5% Marcaine.  A small incision was made just lateral  to the bend of the clavicle with a 15 blade knife.  A large-bore finder  needle was then used to slide behind the bend of the clavicle and we  were able to cannulate the left subclavian vein without difficulty.  A  wire was fed through the finder needle using the Seldinger technique  without difficulty.  The wire was confirmed in the central venous system  using fluoroscopy.  A subcutaneous pocket was then created by a  combination of blunt finger dissection and sharp dissection with the  electrocautery just inferior to the incision,  The well of the port was  then placed in the pocket and the length of the tubing was estimated  also using fluoroscopy, and the tubing was cut to this length.  Next the  sheath and dilator were placed over the wire into the  central venous  system.  The dilator and wire were then removed and a thumb was placed  over the opening in the sheath.  There was good blood return.  The  tubing on the port was then fed through the sheath and the sheath was  then gently cracked and separated, keeping the tubing in place.  We  obtained another fluoroscopy image, which showed that the tip of the  port tubing was just past the cavoatrial junction.  We were able to  disconnect the tubing from the port and remove about a centimeter and a  half of tubing and then reconnect it and then anchor it.  The port was  then placed back into the pocket and anchored to the chest wall with two  interrupted 2-0 Prolene stitches.  The port aspirated blood easily and  was flushed initially with a dilute heparin solution and then with more  concentrated heparin solution.  The port appeared to be in good  position.  The deep layer  of the incision was then closed with  interrupted 3-0 Vicryl stitches and the skin was closed with a running 4-  0 Monocryl  subcuticular stitch.  Benzoin, Steri-Strips and sterile dressings were  applied.  The patient tolerated the procedure well.  At the end of the  case all needle, sponge and instrument counts were correct.  The patient  was then awakened and taken to the recovery room in stable condition.      Ollen Gross. Vernell Morgans, M.D.  Electronically Signed     PST/MEDQ  D:  04/03/2006  T:  04/03/2006  Job:  [id number]

## 2010-10-28 LAB — POCT HEMOGLOBIN-HEMACUE: Hemoglobin: 12.3

## 2011-01-06 IMAGING — US US PELVIS COMPLETE MODIFY
1 series · 13 of 25 positions shown · non-contrast
Comparison: None.

CLINICAL DATA: Endometrial thickening.  History of breast cancer.
Previously on tamoxifen, stopped  [DATE]



[Series 1: us pelvis complete modify · 0.24mm/px · 13 of 70 slices shown]
[im 1/70]
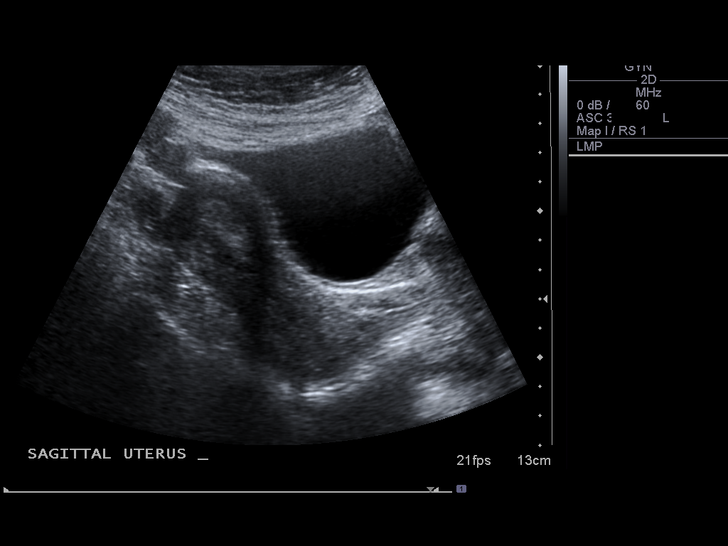
[im 6/70]
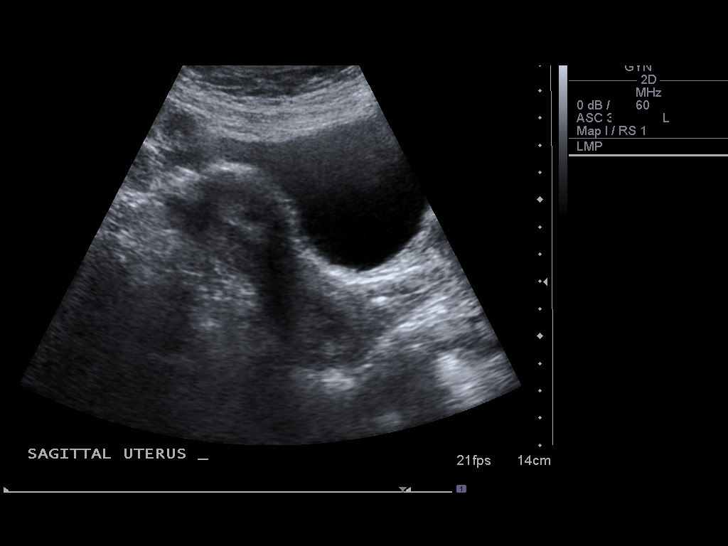
[im 12/70]
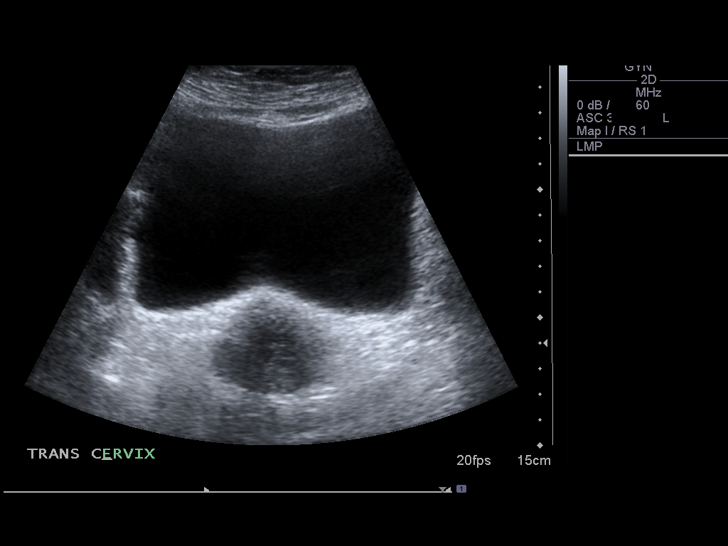
[im 18/70]
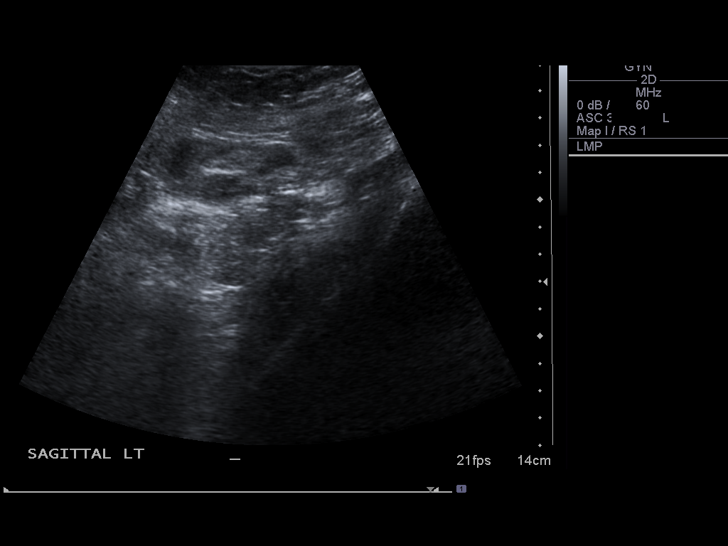
[im 24/70]
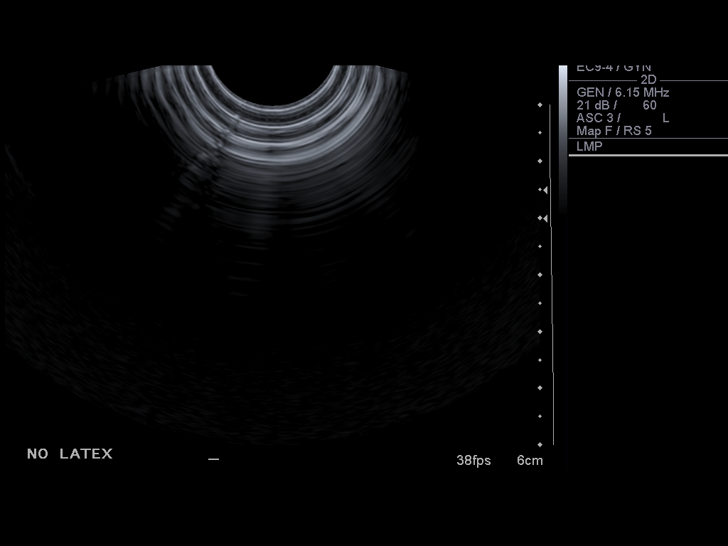
[im 29/70]
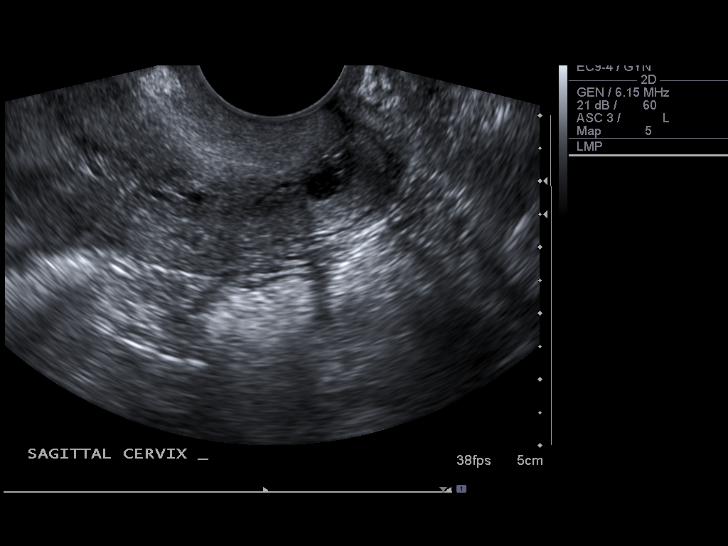
[im 35/70]
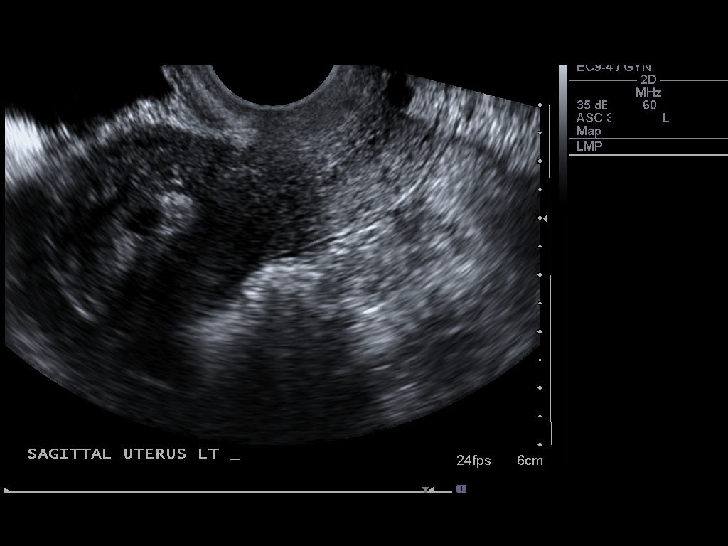
[im 41/70]
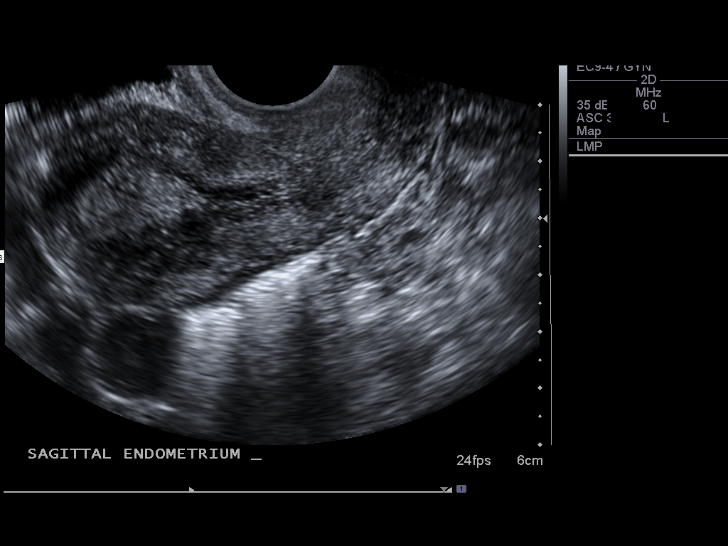
[im 47/70]
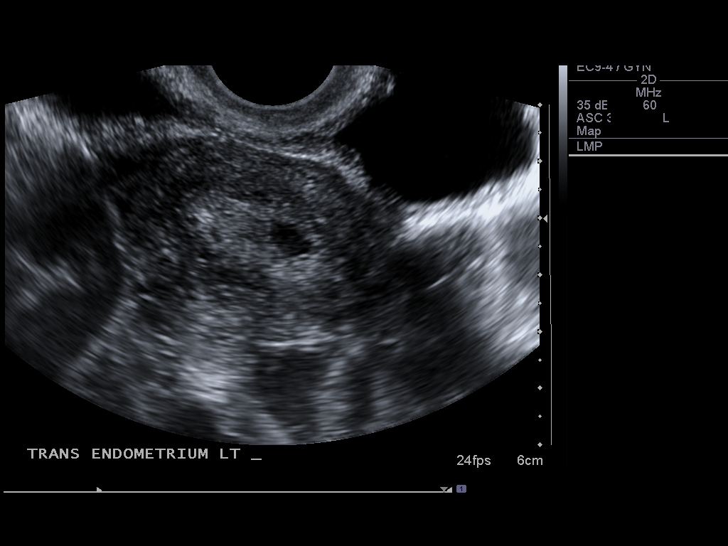
[im 52/70]
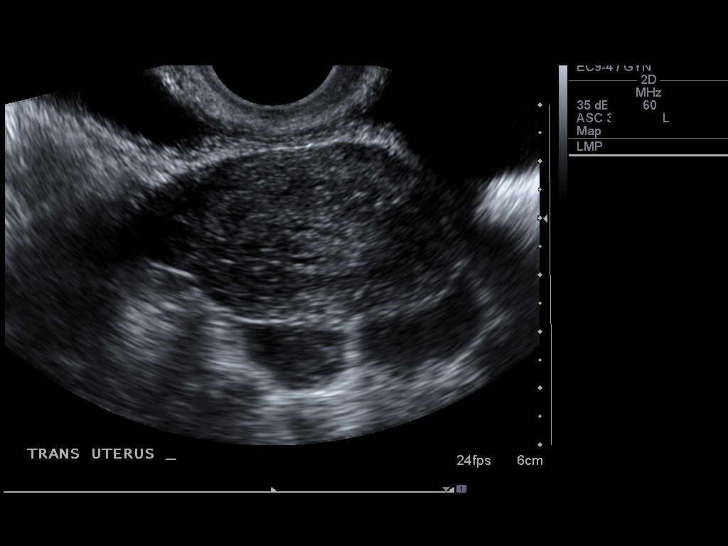
[im 58/70]
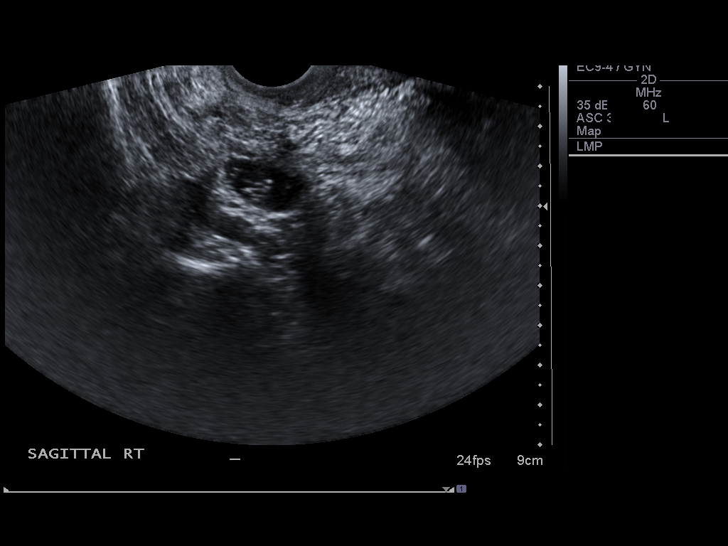
[im 64/70]
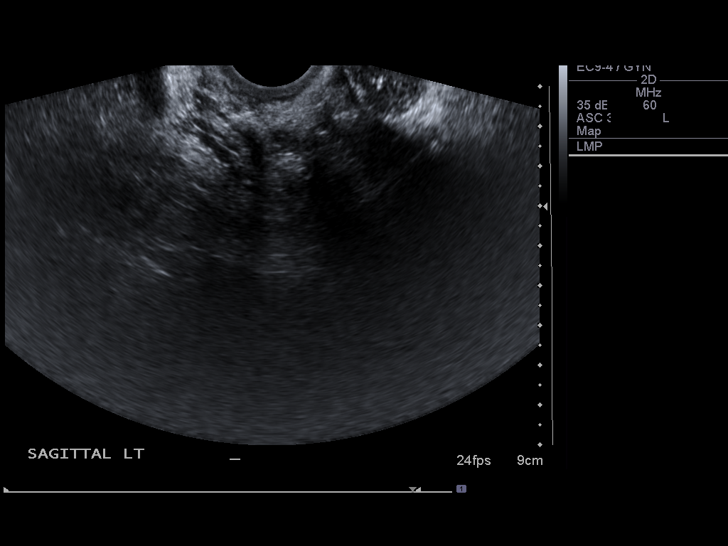
[im 70/70]
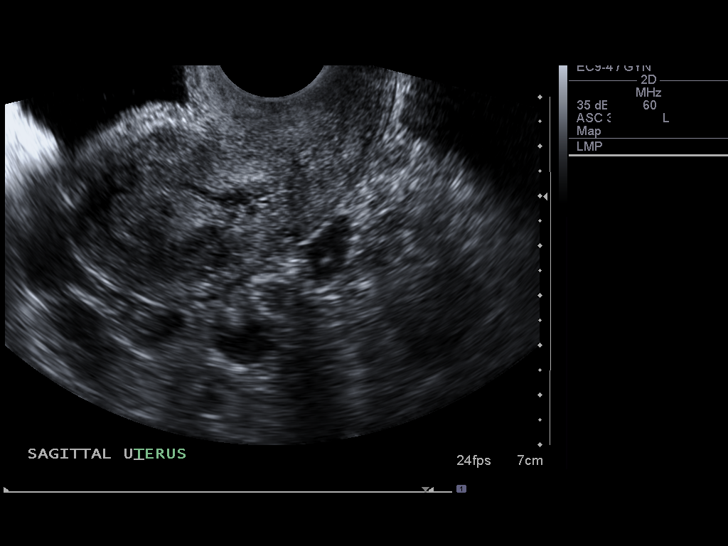

[13 of 25 positions shown; findings below may reference images not displayed]

FINDINGS: Uterus the uterus demonstrates a sagittal length of 7.6 cm, an AP
depth of 3.7 cm and a transverse width of 5.2 cm.  A homogeneous
uterine myometrium is seen.

Endometrium is thickened and heterogeneous with an AP width of 13
mm.  This demonstrates some intracystic areas the largest of which
measures 8 x 6 mm.  Given the history of tamoxifen use, this most
likely represents cystic hyperplasia although a focal polyp could
have a similar appearance.  In the absence of vaginal bleeding,
endometrial carcinoma is felt much less likely.  The myometrial
endometrial interface appears maintained.

Right Ovary is not seen with confidence either transabdominally or
endovaginally

Left Ovary is not seen with confidence either transabdominally or
endovaginally

Other Findings:  No pelvic fluid or separate adnexal masses are
seen.
IMPRESSION: Thickened heterogeneous endometrial lining demonstrating
intracystic change.  The appearance is most suspicious for cystic
hyperplasia or polyp formation.  Endometrial carcinoma is not
excluded with certainty, however is felt less likely if there is no
history of vaginal bleeding.  Further assessment with
sonohysterography can be performed to assess for a focal or diffuse
abnormality, if biopsy is being considered.

Normal myometrium and non-visualized ovaries.

## 2011-01-09 ENCOUNTER — Telehealth: Payer: Self-pay | Admitting: Oncology

## 2011-01-09 NOTE — Telephone Encounter (Signed)
pt called and scheduled her appts for jan2013 °

## 2011-02-05 ENCOUNTER — Other Ambulatory Visit: Payer: Self-pay | Admitting: Oncology

## 2011-02-05 ENCOUNTER — Other Ambulatory Visit (HOSPITAL_BASED_OUTPATIENT_CLINIC_OR_DEPARTMENT_OTHER): Payer: Medicare Other | Admitting: Lab

## 2011-02-05 ENCOUNTER — Ambulatory Visit
Admission: RE | Admit: 2011-02-05 | Discharge: 2011-02-05 | Disposition: A | Discharge status: Home / Self Care | Payer: Medicare Other | Source: Ambulatory Visit | Attending: Oncology | Admitting: Oncology

## 2011-02-05 DIAGNOSIS — Z170 Estrogen receptor positive status [ER+]: Secondary | ICD-10-CM

## 2011-02-05 DIAGNOSIS — Z9889 Other specified postprocedural states: Secondary | ICD-10-CM

## 2011-02-05 DIAGNOSIS — C50419 Malignant neoplasm of upper-outer quadrant of unspecified female breast: Secondary | ICD-10-CM

## 2011-02-05 LAB — CBC WITH DIFFERENTIAL/PLATELET
Basophils Absolute: 0 10*3/uL (ref 0.0–0.1)
Eosinophils Absolute: 0 10*3/uL (ref 0.0–0.5)
HCT: 37.2 % (ref 34.8–46.6)
HGB: 13 g/dL (ref 11.6–15.9)
MONO#: 0.4 10*3/uL (ref 0.1–0.9)
NEUT#: 2.9 10*3/uL (ref 1.5–6.5)
NEUT%: 61.4 % (ref 38.4–76.8)
WBC: 4.7 10*3/uL (ref 3.9–10.3)
lymph#: 1.4 10*3/uL (ref 0.9–3.3)

## 2011-02-05 LAB — CANCER ANTIGEN 27.29: CA 27.29: 26 U/mL (ref 0–39)

## 2011-02-05 LAB — COMPREHENSIVE METABOLIC PANEL
Albumin: 4.3 g/dL (ref 3.5–5.2)
BUN: 15 mg/dL (ref 6–23)
CO2: 26 mEq/L (ref 19–32)
Calcium: 9.1 mg/dL (ref 8.4–10.5)
Chloride: 107 mEq/L (ref 96–112)
Creatinine, Ser: 0.96 mg/dL (ref 0.50–1.10)

## 2011-02-11 ENCOUNTER — Ambulatory Visit (HOSPITAL_BASED_OUTPATIENT_CLINIC_OR_DEPARTMENT_OTHER): Payer: Medicare Other | Admitting: Oncology

## 2011-02-11 ENCOUNTER — Ambulatory Visit: Payer: BC Managed Care – PPO | Admitting: Oncology

## 2011-02-11 ENCOUNTER — Telehealth: Payer: Self-pay | Admitting: Oncology

## 2011-02-11 VITALS — BP 141/81 | HR 67 | Temp 97.7°F | Ht 64.5 in | Wt 176.9 lb

## 2011-02-11 DIAGNOSIS — C50919 Malignant neoplasm of unspecified site of unspecified female breast: Secondary | ICD-10-CM

## 2011-02-11 NOTE — Telephone Encounter (Signed)
Gv pt appt for dec2013 °

## 2011-02-11 NOTE — Progress Notes (Signed)
ID: SALVATORE POE  DOB: [birth year]  MR#: [id number]  CSN#: [id number]   Interval History:   Darel Hong returns today for followup of her breast cancer. She spends a good part of the last year in [place]. 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 In the morning she did line dancing and went hiking. She also spend some of her time visiting children, who are scattered over the [place]. She drives around in a small SUV that she died after her husband died.  ROS:  She is still having hot flashes. They seem to worsen with stress. She doesn't have any problems with vaginal dryness or arthralgias/myalgias that can occur with letrozole. Overall she is tolerating that medication well. A detailed review of systems was otherwise unremarkable. She still of course actively grieving but she is aware that she will hopefully will live many years and she has to "keep with the family and keep busy."  Allergies not on file  Current Outpatient Prescriptions  Medication Sig Dispense Refill  . cefdinir (OMNICEF) 300 MG capsule Take 300 mg by mouth 2 (two) times daily.        . predniSONE (DELTASONE) 5 MG tablet Take 5 mg by mouth as directed. Taper dose for URI          Objective:  Filed Vitals:   02/11/11 1442  BP: 141/81  Pulse: 67  Temp: 97.7 F (36.5 C)    BMI: Body mass index is 29.90 kg/(m^2).   ECOG FS:  Physical Exam:   Sclerae unicteric  Oropharynx clear  No peripheral adenopathy  Lungs clear -- no rales or rhonchi  Heart regular rate and rhythm  Abdomen benign  MSK no focal spinal tenderness, no peripheral edema  Neuro nonfocal  Breast exam: Right breast status post lumpectomy and radiation. No evidence of local recurrence. Left breast no suspicious finding.  Lab Results:      Chemistry      Component Value Date/Time   NA 143 02/05/2011 1347   NA 143 02/05/2011 1347   K 3.8 02/05/2011 1347   K 3.8 02/05/2011 1347   CL 107 02/05/2011 1347   CL 107 02/05/2011 1347   CO2 26 02/05/2011  1347   CO2 26 02/05/2011 1347   BUN 15 02/05/2011 1347   BUN 15 02/05/2011 1347   CREATININE 0.96 02/05/2011 1347   CREATININE 0.96 02/05/2011 1347      Component Value Date/Time   CALCIUM 9.1 02/05/2011 1347   CALCIUM 9.1 02/05/2011 1347   ALKPHOS 79 02/05/2011 1347   ALKPHOS 79 02/05/2011 1347   AST 20 02/05/2011 1347   AST 20 02/05/2011 1347   ALT 13 02/05/2011 1347   ALT 13 02/05/2011 1347   BILITOT 0.4 02/05/2011 1347   BILITOT 0.4 02/05/2011 1347       Lab Results  Component Value Date   WBC 4.7 02/05/2011   HGB 13.0 02/05/2011   HCT 37.2 02/05/2011   MCV 98.6 02/05/2011   PLT 229 02/05/2011   NEUTROABS 2.9 02/05/2011    Studies/Results:  Mm Digital Diagnostic Bilat  02/05/2011  *RADIOLOGY REPORT*  Clinical Data:  History of right breast cancer status post lumpectomy 02/13/2006  DIGITAL DIAGNOSTIC BILATERAL MAMMOGRAM WITH CAD  Comparison:  With priors  Findings:  There are scattered fibroglandular densities.  There is no new suspicious mass or malignant-type microcalcifications in either breast. Stable lumpectomy changes are seen in the right breast. Mammographic images were processed with CAD.  IMPRESSION: No evidence of malignancy in either breast.  Diagnostic mammogram in 1 year is recommended.  BI-RADS CATEGORY 2:  Benign finding(s).  Original Report Authenticated By: Littie Deeds. Judyann Munson, M.D.    Assessment: 66 year old [place] woman status post right lumpectomy and sentinel lymph node dissection January of 2008 for a T1c N0, Stage I invasive ductal carcinoma, grade 3, strongly estrogen and progesterone receptor positive, with an MIB-1 of 34%, and an equivocal FISH. She was treated with partial breast irradiation initially, completed February 1 of 2008, then received carboplatin/docetaxel/trastuzumab, with very poor tolerance. After 2 cycles she was switched to docetaxel and cyclophosphamide with trastuzumab, and received an additional 2 cycles of this chemotherapy, for a total of 4. Her trastuzumab was continued  for 1 year. She received tamoxifen between July of 2008 and August of 2011, when she developed some endometrial thickening. She was then switched to letrozole, which she is tolerating well.  Plan: She will complete her 5 years of antiestrogen therapy this June. I have gone ahead and written her the prescription for letrozole to carry her through. She will not be back in this area however on until December.  Normally I would release her from followup here in June. Lab done is encouraged her to establish yourself with a primary care physician in the [place] area, where 1 of her daughters lives. I gave her a copy of her most recent bone density, mammogram, and today's labs. If she has not establish yourself with a new doctor or by December she will see me otherwise she will call and cancel but in any case she will let us know she is doing at that point. She knows we will be glad to see her on an as-needed basis indefinitely.  [place] Seaborn C 02/11/2011

## 2011-11-12 ENCOUNTER — Other Ambulatory Visit: Payer: Self-pay | Admitting: Oncology

## 2011-11-12 DIAGNOSIS — Z853 Personal history of malignant neoplasm of breast: Secondary | ICD-10-CM

## 2011-11-12 DIAGNOSIS — Z9889 Other specified postprocedural states: Secondary | ICD-10-CM

## 2012-01-26 ENCOUNTER — Ambulatory Visit: Payer: Medicare Other | Admitting: Oncology

## 2012-02-06 ENCOUNTER — Ambulatory Visit
Admission: RE | Admit: 2012-02-06 | Discharge: 2012-02-06 | Disposition: A | Discharge status: Home / Self Care | Payer: Medicare Other | Source: Ambulatory Visit | Attending: Oncology | Admitting: Oncology

## 2012-02-06 DIAGNOSIS — Z9889 Other specified postprocedural states: Secondary | ICD-10-CM

## 2012-02-06 DIAGNOSIS — Z853 Personal history of malignant neoplasm of breast: Secondary | ICD-10-CM

## 2013-01-18 ENCOUNTER — Other Ambulatory Visit: Payer: Self-pay | Admitting: Oncology

## 2013-01-18 DIAGNOSIS — Z853 Personal history of malignant neoplasm of breast: Secondary | ICD-10-CM

## 2013-01-18 DIAGNOSIS — Z9889 Other specified postprocedural states: Secondary | ICD-10-CM

## 2013-02-04 ENCOUNTER — Ambulatory Visit
Admission: RE | Admit: 2013-02-04 | Discharge: 2013-02-04 | Disposition: A | Discharge status: Home / Self Care | Payer: Medicare Other | Source: Ambulatory Visit | Attending: Oncology | Admitting: Oncology

## 2013-02-04 DIAGNOSIS — Z853 Personal history of malignant neoplasm of breast: Secondary | ICD-10-CM

## 2013-02-04 DIAGNOSIS — Z9889 Other specified postprocedural states: Secondary | ICD-10-CM

## 2016-12-04 ENCOUNTER — Ambulatory Visit: Admit: 2016-12-04 | Discharge: 2016-12-18 | Payer: MEDICARE

## 2016-12-08 ENCOUNTER — Ambulatory Visit: Admit: 2016-12-08 | Discharge: 2016-12-08 | Payer: MEDICARE

## 2016-12-08 ENCOUNTER — Encounter: Admit: 2016-12-08 | Discharge: 2016-12-08 | Payer: MEDICARE

## 2016-12-08 DIAGNOSIS — C50919 Malignant neoplasm of unspecified site of unspecified female breast: Principal | ICD-10-CM

## 2016-12-08 LAB — CA-27.29

## 2016-12-09 ENCOUNTER — Encounter: Admit: 2016-12-09 | Discharge: 2016-12-09 | Payer: MEDICARE

## 2016-12-18 DIAGNOSIS — Z171 Estrogen receptor negative status [ER-]: ICD-10-CM

## 2016-12-18 DIAGNOSIS — Z853 Personal history of malignant neoplasm of breast: ICD-10-CM

## 2016-12-18 DIAGNOSIS — Z08 Encounter for follow-up examination after completed treatment for malignant neoplasm: Principal | ICD-10-CM

## 2017-03-31 NOTE — Progress Notes (Signed)
This encounter was created in error - please disregard.

## 2017-04-20 ENCOUNTER — Encounter: Admit: 2017-04-20 | Discharge: 2017-04-20 | Payer: MEDICARE

## 2017-04-20 DIAGNOSIS — C50412 Malignant neoplasm of upper-outer quadrant of left female breast: Principal | ICD-10-CM

## 2017-05-26 ENCOUNTER — Encounter: Admit: 2017-05-26 | Discharge: 2017-05-26 | Payer: MEDICARE

## 2017-05-26 DIAGNOSIS — C50412 Malignant neoplasm of upper-outer quadrant of left female breast: Principal | ICD-10-CM

## 2017-12-04 ENCOUNTER — Ambulatory Visit: Admit: 2017-12-04 | Discharge: 2017-12-18 | Payer: MEDICARE

## 2017-12-09 ENCOUNTER — Encounter: Admit: 2017-12-09 | Discharge: 2017-12-09 | Payer: MEDICARE

## 2017-12-09 ENCOUNTER — Ambulatory Visit: Admit: 2017-12-09 | Discharge: 2017-12-09 | Payer: MEDICARE

## 2017-12-09 DIAGNOSIS — C50919 Malignant neoplasm of unspecified site of unspecified female breast: Principal | ICD-10-CM

## 2017-12-09 DIAGNOSIS — C50412 Malignant neoplasm of upper-outer quadrant of left female breast: ICD-10-CM

## 2017-12-18 DIAGNOSIS — Z171 Estrogen receptor negative status [ER-]: Secondary | ICD-10-CM

## 2017-12-18 DIAGNOSIS — Z08 Encounter for follow-up examination after completed treatment for malignant neoplasm: Principal | ICD-10-CM

## 2017-12-18 DIAGNOSIS — Z853 Personal history of malignant neoplasm of breast: ICD-10-CM

## 2018-12-20 ENCOUNTER — Ambulatory Visit: Admit: 2018-12-20 | Discharge: 2018-12-20 | Payer: MEDICARE

## 2018-12-29 ENCOUNTER — Encounter: Admit: 2018-12-29 | Discharge: 2018-12-29 | Payer: MEDICARE

## 2019-02-11 ENCOUNTER — Ambulatory Visit: Admit: 2019-02-11 | Discharge: 2019-02-11 | Payer: MEDICARE

## 2019-02-11 ENCOUNTER — Encounter: Admit: 2019-02-11 | Discharge: 2019-02-11 | Payer: MEDICARE

## 2019-02-11 DIAGNOSIS — C50919 Malignant neoplasm of unspecified site of unspecified female breast: Secondary | ICD-10-CM

## 2019-02-11 DIAGNOSIS — C50412 Malignant neoplasm of upper-outer quadrant of left female breast: Secondary | ICD-10-CM

## 2019-02-11 NOTE — Progress Notes
Radiation Oncology Follow-Up Note  Date: 02/11/2019       Tamara Fuentes is a 74 y.o. female.     The encounter diagnosis was Malignant neoplasm of upper-outer quadrant of left breast in female, estrogen receptor negative (HCC).  Bilateral Metachronous Breast Cancer.  0.7 cm, pTis N0 M0, stage 0,? 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  ? 0.2 cm on Core Biopsy.  ? Intermediate Grade.  ? Comedo Necrosis.  ? ER- (0%) / PR- (0%).  ? Negative Margins.  2.5 cm, cT2 N0 M0, stage II,? Right Invasive Ductal Carcinoma (2007).  ? ER+ (34%) / PR+ (90%)  ? HER-2/neu Equivocal by IHC (2+), However EEP17 Signal of 2.0 was interpreted by HemOnc as Positive.  ? Ki-67 34%.      PREVIOUS PROCEDURES / TREATMENTS:  02/13/06? Right Breast Lumpectomy / SNB ([place]).  02/2006   Right Partial Breast Irradiation [facility], [place]).  04/2006?? Carbo/Taxol x 2 - D/C Due to Poor Tolerance.  07/2006?? Completed Cytoxan / Taxol x 4 ([place]).  03/2007?? Completed Herceptin x 1 year [place], [place]).  2012??????? Discontinued Tamoxifen due to Toxicity.  09/2011?? Completed Aromatase Inhibitor.  07/03/15? Left Breast Stereotactic Biopsy [facility]).  07/18/15? Left Breast Lumpectomy (Failing).  07/31/15  Completed Left Partial breast irradiation to 34 Gy.      Subjective:           The patient is seen today for her regularly scheduled annual follow-up.  She was last seen in the clinic 1 year ago in November 2019.    She continues to follow with her primary care physician.  She reports she is doing well and has no new issues or concerns to discuss.  She continues to get her annual screening mammograms in the spring.          Review of Systems   Constitutional: Negative.    HENT: Negative.    Eyes: Negative.    Respiratory: Negative.    Cardiovascular: Negative.    Gastrointestinal: Negative.    Endocrine: Negative.    Genitourinary: Negative.    Musculoskeletal: Negative.    Skin: Negative.  Negative for color change. Allergic/Immunologic: Negative.    Neurological: Negative.    Hematological: Negative.    Psychiatric/Behavioral: Negative.          Objective:         ? ascorbic acid (VITAMIN C PO) Take  by mouth.   ? aspirin EC 81 mg tablet Take 81 mg by mouth daily. Take with food.   ? CALCIUM PO Take 2 Tabs by mouth daily.   ? DOCUSATE CALCIUM (STOOL SOFTENER PO) Take  by mouth as Needed.   ? ergocalciferol (vitamin D2) (VITAMIN D PO) Take  by mouth.   ? FERROUS SULFATE (IRON PO) Take  by mouth.   ? glucosamine(+) 500 mg tab Take 500 mg by mouth twice daily with meals.   ? PRENATAL VITS/IRON FUM/FOLIC (M-VIT PO) Take  by mouth daily.     Vitals:    02/11/19 1429   BP: 105/55   Pulse: 56   Resp: 18   Temp: 36.2 ?C (97.2 ?F)   SpO2: 100%   Weight: 76.9 kg (169 lb 9.6 oz)   PainSc: Zero     Body mass index is 28.22 kg/m?Marland Kitchen     Pain Score: Zero        Fatigue Scale: 0-None    KARNOFSKY PERFORMANCE SCORE:  100% Normal, no complaints  Physical Exam  Vitals signs and nursing note reviewed.   Constitutional:       Appearance: Normal appearance. She is well-developed.   HENT:      Head: Normocephalic and atraumatic.   Eyes:      Extraocular Movements: Extraocular movements intact.      Pupils: Pupils are equal, round, and reactive to light.   Neck:      Musculoskeletal: Normal range of motion.   Cardiovascular:      Rate and Rhythm: Normal rate and regular rhythm.      Pulses: Normal pulses.      Heart sounds: Normal heart sounds.   Pulmonary:      Effort: Pulmonary effort is normal.      Breath sounds: Normal breath sounds.   Chest: Comments:  Patient's breasts were examined in the upright position with her expressed verbal consent.  On visual inspection the patient's breasts are of moderate size and very pendulous.   The surgical incision in the lateral aspect of the right breast is clean, dry, intact well-healed.  There is postoperative defect in the right breast consistent with the removal of tissue at the time of the surgery.    The rash observed on the patient's breast is actually a small area of 2 or 3 telangiectasias on the skin with no induration or other area of concern.  The appearance is consistent with treatment effect rather than a recurrence. The remainder of the bilateral breasts are soft without palpable masses, nodules, lesions, ulcerations, skin dimpling, put orange changes, nipple retraction or discharge.  Skin:     General: Skin is warm and dry.   Neurological:      General: No focal deficit present.      Mental Status: She is alert and oriented to person, place, and time.   Psychiatric:         Behavior: Behavior is cooperative.         Thought Content: Thought content normal.                Imaging:  Jun 14, 2018 bilateral diagnostic mammogram ([facility]): BI-RADS Category 2, benign.    May 23, 2016 bilateral diagnostic mammogram: No significant suspicious findings bilaterally.       Assessment and Plan:  The patient was seen today for annual follow-up after completion of radiation therapy.    ?At this time, the patient is clinically stable, with no clinical evidence of disease recurrence or progression.  ?Our plan will be for the patient to return to our clinic in 1 year for continued follow-up.  ?We will plan to continue with annual surveillance imaging in the spring.  ?The patient will keep their appointments with their other managing physicians.  She may contact us in the interim if there are any questions or concerns requiring earlier assessment.      Elby Showers, MD I spent a total of  15 minutes today in direct patient evaluation with more than 50% spent in treatment plan formulation, counseling, symptom management discussion and consultation as outlined above.     Parts of this note were created using voice recognition software.  Please excuse any grammatical or typographical errors.

## 2019-12-20 ENCOUNTER — Ambulatory Visit: Admit: 2019-12-20 | Discharge: 2019-12-20 | Payer: MEDICARE

## 2019-12-20 ENCOUNTER — Ambulatory Visit: Admit: 2019-12-20 | Discharge: 2019-12-27 | Payer: MEDICARE

## 2019-12-27 ENCOUNTER — Encounter: Admit: 2019-12-27 | Discharge: 2019-12-27 | Payer: MEDICARE

## 2019-12-27 DIAGNOSIS — C50412 Malignant neoplasm of upper-outer quadrant of left female breast: Secondary | ICD-10-CM

## 2019-12-27 DIAGNOSIS — C50919 Malignant neoplasm of unspecified site of unspecified female breast: Secondary | ICD-10-CM

## 2019-12-27 NOTE — Progress Notes
Radiation Oncology Follow-Up Note  Date: 12/27/2019       Tamara Fuentes is a 74 y.o. female.     The encounter diagnosis was Malignant neoplasm of upper-outer quadrant of left breast in female, estrogen receptor negative (HCC).  Bilateral Metachronous Breast Cancer.  0.7 cm, pTis N0 M0, stage 0,? 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  ? 0.2 cm on Core Biopsy.  ? Intermediate Grade.  ? Comedo Necrosis.  ? ER- (0%) / PR- (0%).  ? Negative Margins.  2.5 cm, cT2 N0 M0, stage II,? Right Invasive Ductal Carcinoma (2007).  ? ER+ (34%) / PR+ (90%)  ? HER-2/neu Equivocal by IHC (2+), However EEP17 Signal of 2.0 was interpreted by HemOnc as Positive.  ? Ki-67 34%.      PREVIOUS PROCEDURES / TREATMENTS:  02/13/06? Right Breast Lumpectomy / SNB ([place]).  02/2006   Right Partial Breast Irradiation [facility]  04/2006?? Carbo/Taxol x 2 - D/C Due to Poor Tolerance.  07/2006?? Completed Cytoxan / Taxol x 4 ([place]).  03/2007?? Completed Herceptin x 1 year [place], [place]).  2012??????? Discontinued Tamoxifen due to Toxicity.  09/2011?? Completed Aromatase Inhibitor.  07/03/15? Left Breast Stereotactic Biopsy [facility]).  07/18/15? Left Breast Lumpectomy (Failing).  07/31/15  Completed Left Partial breast irradiation to 34 Gy.      Subjective:           Mrs. Grade is seen today for routine annual follow-up.  On evaluation today, she continues to follow with her primary care physician for basic lab work.  Her most recent mammogram was performed March 2021 and was benign.  She denies any headaches, dizziness, nausea, vomiting, chest pain, shortness of breath, cough, sputum changes, fevers, chills, night sweats.  She denies any bowel or bladder complaints.  She denies any breast pain or breast tenderness.  She denies any nipple discharge nipple retraction unusual rashes nodules or lesions.  She reports relatively good symmetry of the breasts and no edema.  Good range of motion of the upper extremities.          Review of Systems   Constitutional: Negative.    HENT: Negative.    Eyes: Negative.    Respiratory: Negative.    Cardiovascular: Negative.    Gastrointestinal: Negative.    Endocrine: Negative.    Genitourinary: Negative.    Musculoskeletal: Negative.    Skin: Negative.  Negative for color change.   Allergic/Immunologic: Negative.    Neurological: Negative.    Hematological: Negative.    Psychiatric/Behavioral: Negative.          Objective:         ? ascorbic acid (VITAMIN C PO) Take  by mouth.   ? aspirin EC 81 mg tablet Take 81 mg by mouth daily. Take with food.   ? CALCIUM PO Take 2 Tabs by mouth daily.   ? DOCUSATE CALCIUM (STOOL SOFTENER PO) Take  by mouth as Needed.   ? ergocalciferol (vitamin D2) (VITAMIN D PO) Take  by mouth.   ? FERROUS SULFATE (IRON PO) Take  by mouth.   ? glucosamine(+) 500 mg tab Take 500 mg by mouth twice daily with meals.   ? PRENATAL VITS/IRON FUM/FOLIC (M-VIT PO) Take  by mouth daily.     Vitals:    12/27/19 1341   BP: 114/54   Pulse: 56   Resp: 18   Temp: 36.5 ?C (97.7 ?F)   SpO2: 100%   Weight: 80.2 kg (176 lb  12.8 oz)   PainSc: Zero     Body mass index is 29.42 kg/m?Marland Kitchen     Pain Score: Zero        Fatigue Scale: 0-None    KARNOFSKY PERFORMANCE SCORE:  100% Normal, no complaints     Physical Exam  Vitals and nursing note reviewed.   Constitutional:       Appearance: Normal appearance. She is well-developed.   HENT:      Head: Normocephalic and atraumatic.   Eyes:      Extraocular Movements: Extraocular movements intact.      Pupils: Pupils are equal, round, and reactive to light.   Cardiovascular:      Rate and Rhythm: Normal rate and regular rhythm.      Pulses: Normal pulses.      Heart sounds: Normal heart sounds.   Pulmonary:      Effort: Pulmonary effort is normal.      Breath sounds: Normal breath sounds.   Chest:      Comments:  Patient's breasts were examined in the upright position with her expressed verbal consent.  On visual inspection the patient's breasts are pendulous.   The surgical incision in the lateral aspect of the right breast is clean, dry, intact well-healed.  Stable posttreatment changes without discrete nodularity appreciated.  The remainder of the bilateral breasts are soft without palpable masses, nodules, lesions, ulcerations, skin dimpling, put orange changes, nipple retraction or discharge.  Musculoskeletal:      Cervical back: Normal range of motion.   Lymphadenopathy:      Upper Body:      Right upper body: No supraclavicular, axillary or pectoral adenopathy.      Left upper body: No supraclavicular, axillary or pectoral adenopathy.   Skin:     General: Skin is warm and dry.   Neurological:      General: No focal deficit present.      Mental Status: She is alert and oriented to person, place, and time.   Psychiatric:         Behavior: Behavior is cooperative.         Thought Content: Thought content normal.                Imaging:  05/04/2019 bilateral diagnostic mammogram ([facility]): BI-RADS Category 2, benign    Jun 14, 2018 bilateral diagnostic mammogram ([facility]): BI-RADS Category 2, benign.    May 23, 2016 bilateral diagnostic mammogram: No significant suspicious findings bilaterally.       Assessment and Plan:  The patient was seen today for annual follow-up after completion of radiation therapy.    ?She is clinically stable at this time without clinical evidence of disease recurrence or progression and stable posttreatment related changes in the breast tissues.  ?Our plan will be for the patient to return to our clinic in 1 year for continued follow-up.  ?We will plan to continue with annual surveillance imaging in the spring.  ?The patient will keep their appointments with their other managing physicians.  She may contact us in the interim if there are any questions or concerns requiring earlier assessment.      Elby Showers, MD    I spent a total of  15 minutes today in direct patient evaluation with more than 50% spent in treatment plan formulation, counseling, symptom management discussion and consultation as outlined above.     Parts of this note were created using voice recognition software.  Please excuse any grammatical  or typographical errors.

## 2020-12-11 ENCOUNTER — Encounter: Admit: 2020-12-11 | Discharge: 2020-12-11 | Payer: MEDICARE

## 2020-12-11 ENCOUNTER — Ambulatory Visit: Admit: 2020-12-11 | Discharge: 2020-12-11 | Payer: MEDICARE

## 2020-12-11 DIAGNOSIS — C50919 Malignant neoplasm of unspecified site of unspecified female breast: Secondary | ICD-10-CM

## 2020-12-11 DIAGNOSIS — C50412 Malignant neoplasm of upper-outer quadrant of left female breast: Secondary | ICD-10-CM

## 2020-12-11 NOTE — Progress Notes
Radiation Oncology Follow-Up Note  Date: 12/11/2020       Tamara Fuentes is a 75 y.o. female.     The encounter diagnosis was Malignant neoplasm of upper-outer quadrant of left breast in female, estrogen receptor negative (HCC).  Bilateral Metachronous Breast Cancer.  0.7 cm, pTis N0 M0, stage 0,? 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  ? 0.2 cm on Core Biopsy.  ? Intermediate Grade.  ? Comedo Necrosis.  ? ER- (0%) / PR- (0%).  ? Negative Margins.  2.5 cm, cT2 N0 M0, stage II,? Right Invasive Ductal Carcinoma (2007).  ? ER+ (34%) / PR+ (90%)  ? HER-2/neu Equivocal by IHC (2+), However EEP17 Signal of 2.0 was interpreted by HemOnc as Positive.  ? Ki-67 34%.      PREVIOUS PROCEDURES / TREATMENTS:  02/13/06? Right Breast Lumpectomy / SNB ([place]).  02/2006   Right Partial Breast Irradiation [facility] [place]).  04/2006?? Carbo/Taxol x 2 - D/C Due to Poor Tolerance.  07/2006?? Completed Cytoxan / Taxol x 4 ([place]).  03/2007?? Completed Herceptin x 1 year [place], [place]).  2012??????? Discontinued Tamoxifen due to Toxicity.  09/2011?? Completed Aromatase Inhibitor.  07/03/15? Left Breast Stereotactic Biopsy [facility]).  07/18/15? Left Breast Lumpectomy (Failing).  07/31/15  Completed Left Partial breast irradiation to 34 Gy.      Subjective:             Tamara Fuentes returns today for annual follow-up.  On evaluation, she reports that she is doing fairly well.  She recently returned from working the season at the [employer] and says she got 12-15,000 steps in every day that she worked.  She says that she feels good.  She denies headaches, dizziness, nausea, vomiting, chest pain, shortness of breath, cough, sputum changes, fevers, chills, night sweats.  She had her most recent mammogram performed in April 2022 which showed BI-RADS 1.  She has no new lumps, bumps, rashes, nodules, lesions or ulcerations to report.  No concerns regarding the cosmetic appearance of her breast.  She reports good range of motion.  All other review of systems are negative.              Review of Systems   Constitutional: Negative.    HENT: Negative.    Eyes: Negative.    Respiratory: Negative.    Cardiovascular: Negative.    Gastrointestinal: Negative.    Endocrine: Negative.    Genitourinary: Negative.    Musculoskeletal: Negative.    Skin: Negative.  Negative for color change.   Allergic/Immunologic: Negative.    Neurological: Negative.    Hematological: Negative.    Psychiatric/Behavioral: Negative.          Objective:         ? ascorbic acid (VITAMIN C PO) Take  by mouth.   ? aspirin EC 81 mg tablet Take 81 mg by mouth daily. Take with food.   ? CALCIUM PO Take 2 Tabs by mouth daily.   ? DOCUSATE CALCIUM (STOOL SOFTENER PO) Take  by mouth as Needed.   ? ergocalciferol (vitamin D2) (VITAMIN D PO) Take  by mouth.   ? FERROUS SULFATE (IRON PO) Take  by mouth.   ? glucosamine(+) 500 mg tab Take 500 mg by mouth twice daily with meals.   ? PRENATAL VITS/IRON FUM/FOLIC (M-VIT PO) Take  by mouth daily.     Vitals:    12/11/20 1434   BP: 93/55   Pulse: 53  Temp: 36.6 ?C (97.8 ?F)   Resp: 18   SpO2: 100%   PainSc: Zero   Weight: 75.1 kg (165 lb 9.6 oz)     Body mass index is 27.56 kg/m?Marland Kitchen     Pain Score: Zero        Fatigue Scale: 0-None    KARNOFSKY PERFORMANCE SCORE:  100% Normal, no complaints     Physical Exam  Vitals and nursing note reviewed.   Constitutional:       Appearance: Normal appearance. She is well-developed.   HENT:      Head: Normocephalic and atraumatic.   Eyes:      Extraocular Movements: Extraocular movements intact.      Pupils: Pupils are equal, round, and reactive to light.   Cardiovascular:      Rate and Rhythm: Normal rate and regular rhythm.      Pulses: Normal pulses.      Heart sounds: Normal heart sounds.   Pulmonary:      Effort: Pulmonary effort is normal.      Breath sounds: Normal breath sounds.   Chest:          Comments:  Patient's breasts were examined in the upright position with her expressed verbal consent.  On visual inspection the patient's breasts are pendulous.   The surgical incision in the lateral aspect of the right breast is clean, dry, intact well-healed.  Stable posttreatment changes in the lateral right breast without discrete nodularity appreciated.  The remainder of the bilateral breasts are soft without palpable masses, nodules, lesions, ulcerations, skin dimpling, put orange changes, nipple retraction or discharge.  Musculoskeletal:      Cervical back: Normal range of motion.   Lymphadenopathy:      Upper Body:      Right upper body: No supraclavicular, axillary or pectoral adenopathy.      Left upper body: No supraclavicular, axillary or pectoral adenopathy.   Skin:     General: Skin is warm and dry.   Neurological:      General: No focal deficit present.      Mental Status: She is alert and oriented to person, place, and time.   Psychiatric:         Behavior: Behavior is cooperative.         Thought Content: Thought content normal.                Imaging:  05/24/2020 bilateral diagnostic mammogram ([facility]): BI-RADS Category 1, benign    05/04/2019 bilateral diagnostic mammogram [facility]): BI-RADS Category 2, benign    Jun 14, 2018 bilateral diagnostic mammogram ([facility]): BI-RADS Category 2, benign.    May 23, 2016 bilateral diagnostic mammogram: No significant suspicious findings bilaterally.       Assessment and Plan:  The patient was seen today for annual follow-up after completion of radiation therapy.    -The patient is clinically stable at this time with no clinical evidence of disease recurrence or progression in the bilateral breasts.  Stable posttreatment related changes.  I will plan to see her back in 1 year for ongoing surveillance.  She will keep her appointments with her other managing physicians and I will see her in the interim if there are questions or concerns requiring earlier assessment.     I am grateful for the opportunity to participate in the care of Mrs. Aguirre.    Please do not hesitate contact me if there are questions regarding today's visit.  Elby Showers, MD      I spent a total of  15 minutes today in direct patient evaluation with more than 50% spent in treatment plan formulation, counseling, symptom management discussion and consultation as outlined above.     Parts of this note were created using voice recognition software.  Please excuse any grammatical or typographical errors.

## 2022-01-14 ENCOUNTER — Encounter: Admit: 2022-01-14 | Discharge: 2022-01-14 | Payer: MEDICARE

## 2022-05-13 ENCOUNTER — Encounter: Admit: 2022-05-13 | Discharge: 2022-05-13 | Payer: MEDICARE

## 2022-07-14 ENCOUNTER — Encounter: Admit: 2022-07-14 | Discharge: 2022-07-14 | Payer: MEDICARE

## 2023-06-16 ENCOUNTER — Encounter: Admit: 2023-06-16 | Discharge: 2023-06-16 | Payer: MEDICARE

## 2023-10-16 ENCOUNTER — Encounter: Admit: 2023-10-16 | Discharge: 2023-10-16 | Payer: MEDICARE

## 2023-10-19 ENCOUNTER — Encounter: Admit: 2023-10-19 | Discharge: 2023-10-19 | Payer: MEDICARE

## 2023-10-19 DIAGNOSIS — C50911 Malignant neoplasm of unspecified site of right female breast: Principal | ICD-10-CM

## 2023-10-20 ENCOUNTER — Encounter: Admit: 2023-10-20 | Discharge: 2023-10-20 | Payer: MEDICARE

## 2023-10-20 DIAGNOSIS — C50412 Malignant neoplasm of upper-outer quadrant of left female breast: Principal | ICD-10-CM

## 2023-10-20 DIAGNOSIS — C50511 Malignant neoplasm of lower-outer quadrant of right female breast: Secondary | ICD-10-CM

## 2023-10-23 ENCOUNTER — Encounter: Admit: 2023-10-23 | Discharge: 2023-10-23 | Payer: MEDICARE

## 2023-10-26 ENCOUNTER — Encounter: Admit: 2023-10-26 | Discharge: 2023-10-26 | Payer: MEDICARE
